# Patient Record
Sex: Female | Born: 1975
Health system: Midwestern US, Academic
[De-identification: ages and names within clinical notes are randomized; demographics above are authoritative.]

---

## 2016-01-16 IMAGING — CR LOW_EXM
2 series · 2 of 2 positions shown · non-contrast
Comparison: none

[ankle ap]
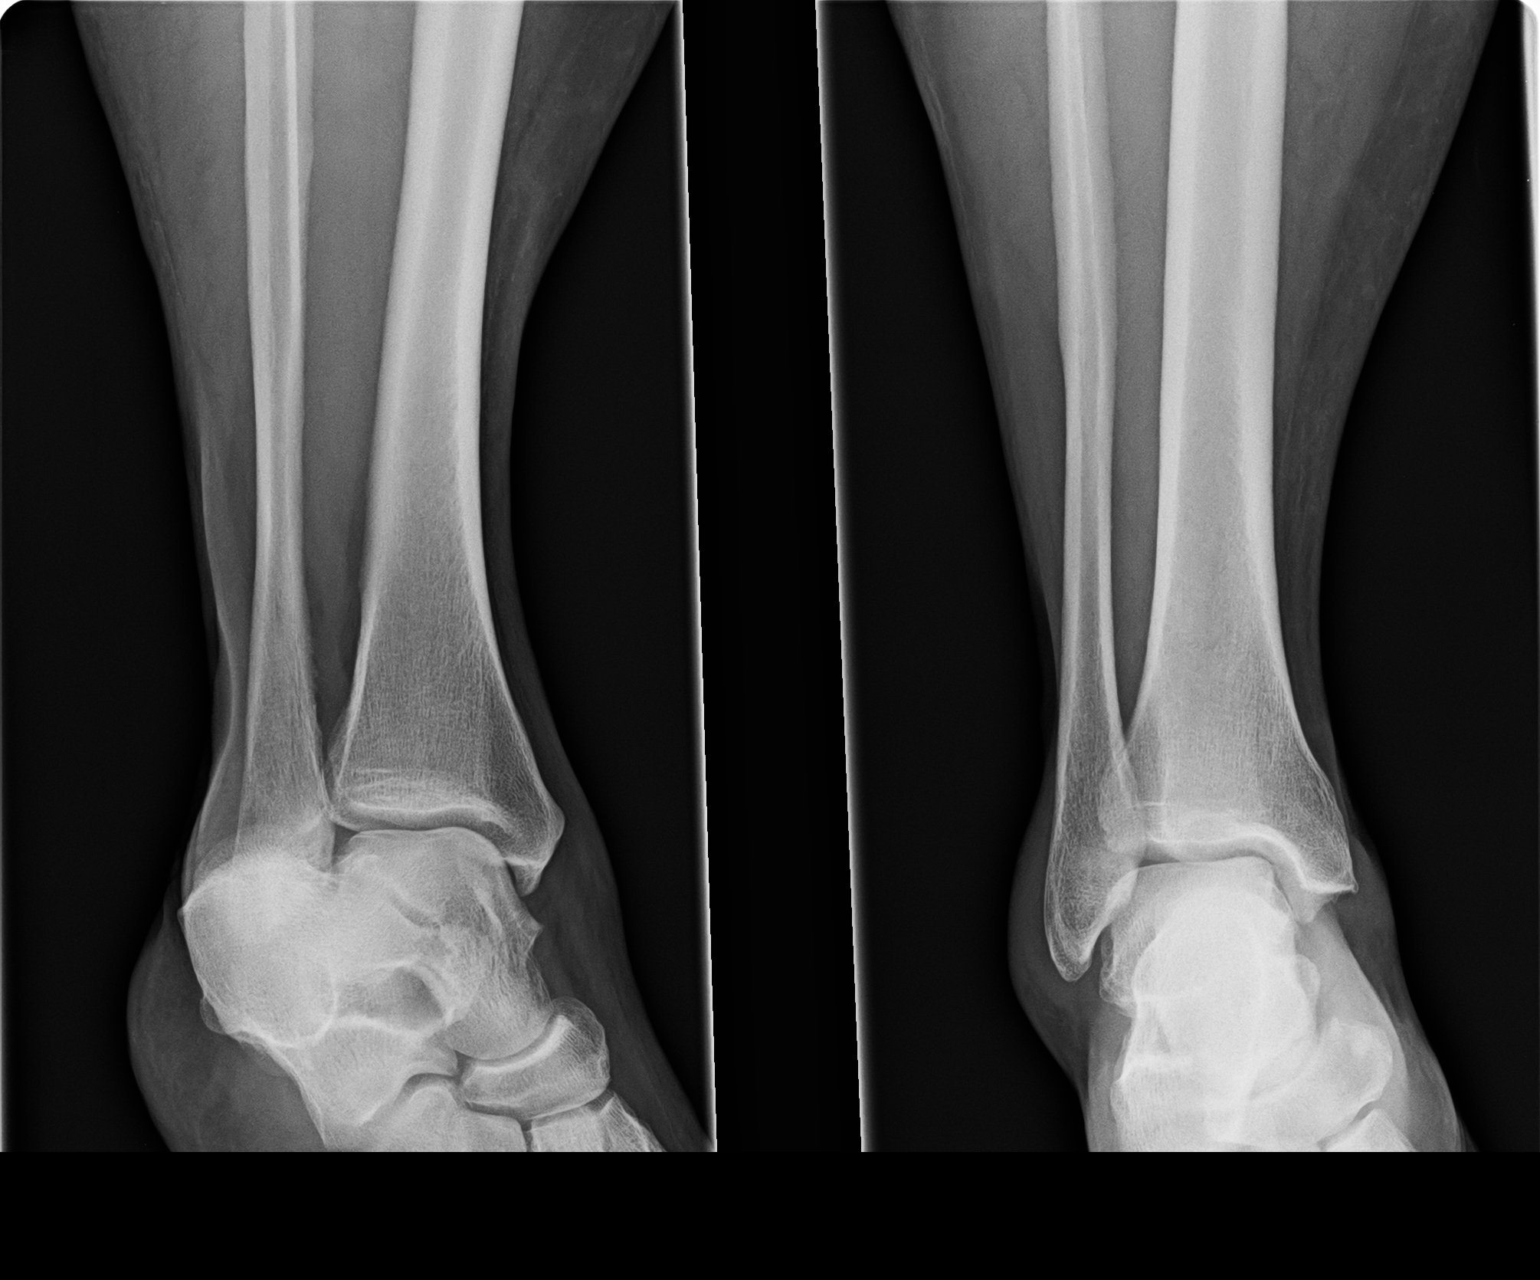

[ankle lat]
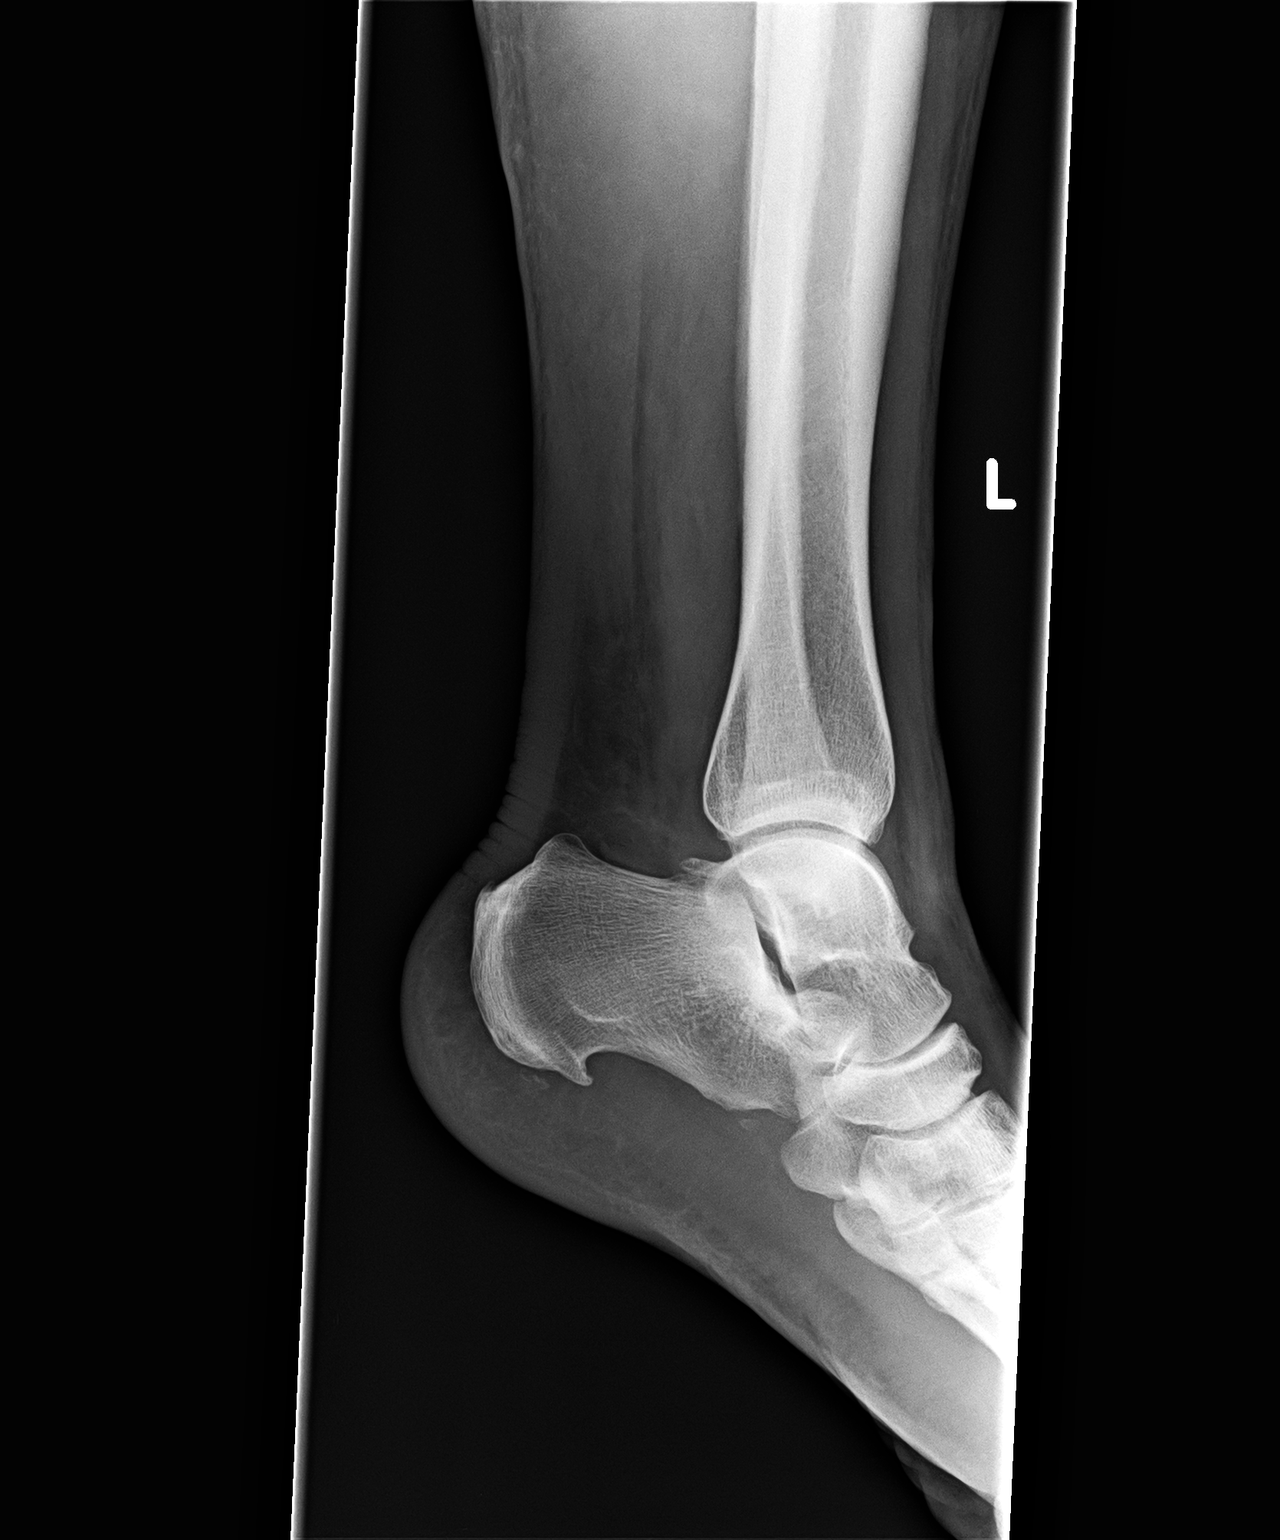

[2 of 2 positions shown; findings below may reference images not displayed]

EXAM

Left ankle.

INDICATION

walking and heard a pop. ankle pain.
TWISTED LT ANKLE WHILE WALKING. HEARD A POP. LT ANKLE PAIN AND SWELLING TO
LATERAL SIDE. NO PREV

FINDINGS

Three views of the left ankle were obtained.

There is soft tissue swelling over the lateral malleolus. There are small osteophytes of the
inferior margin of the medial malleolus. There is a heel spur at the insertion site of the plantar
fascia very small heel spur at the insertion site of the Achilles tendon.

IMPRESSION

There is no fracture or acute appearing abnormality of the left ankle. There is soft tissue
swelling laterally. There is a moderately sized heel spur at the insertion site of the plantar
fascia.

## 2016-08-19 IMAGING — CR PELVIS
5 series · 5 of 5 positions shown · non-contrast
Comparison: none

[l-spine ap]
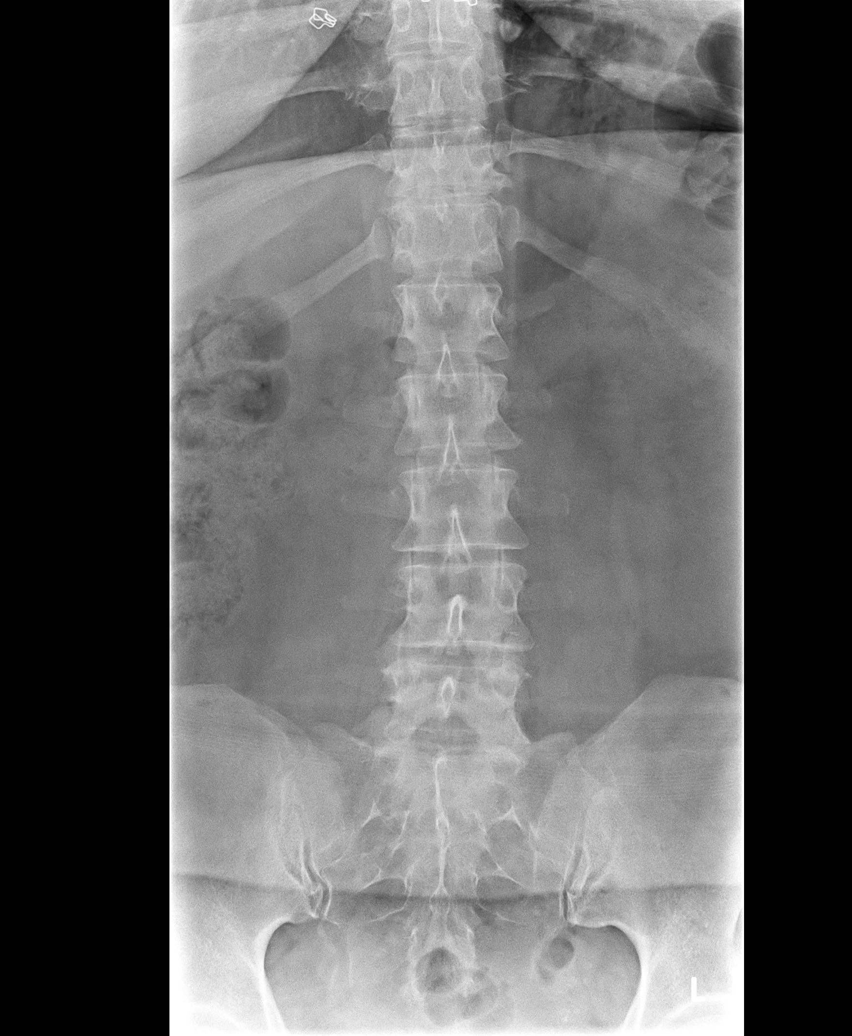

[l-spine obl (1 of 2)]
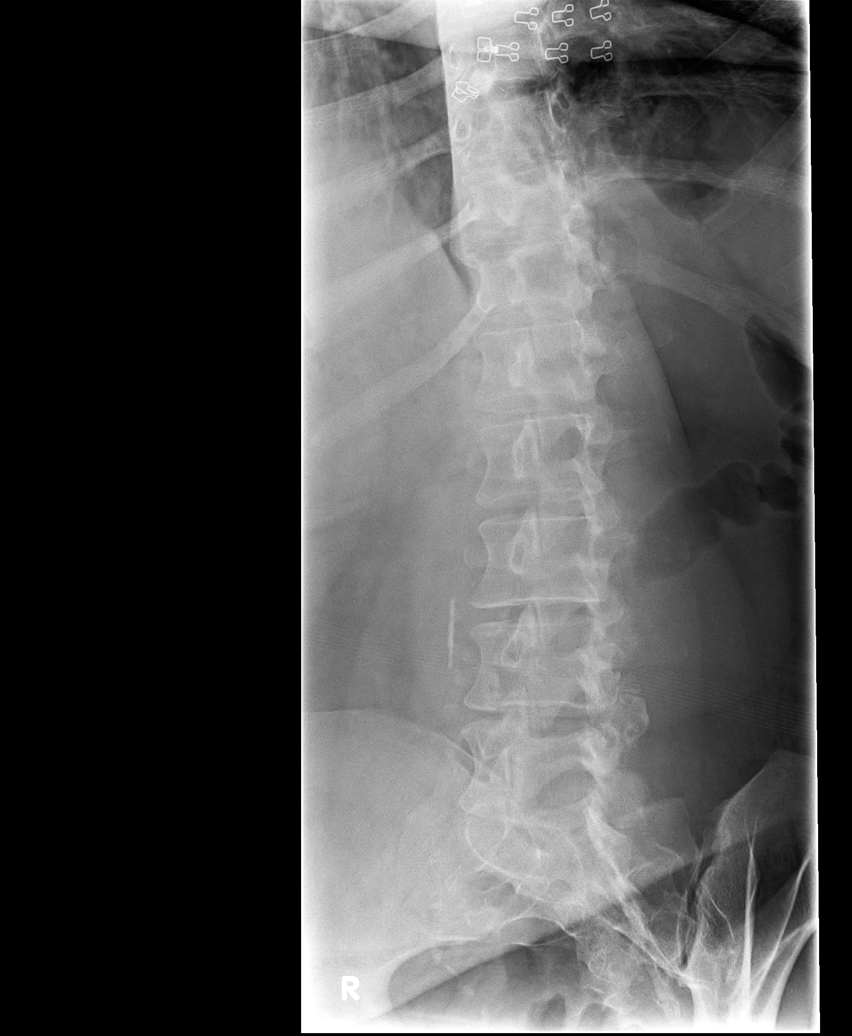

[l-spine obl (2 of 2)]
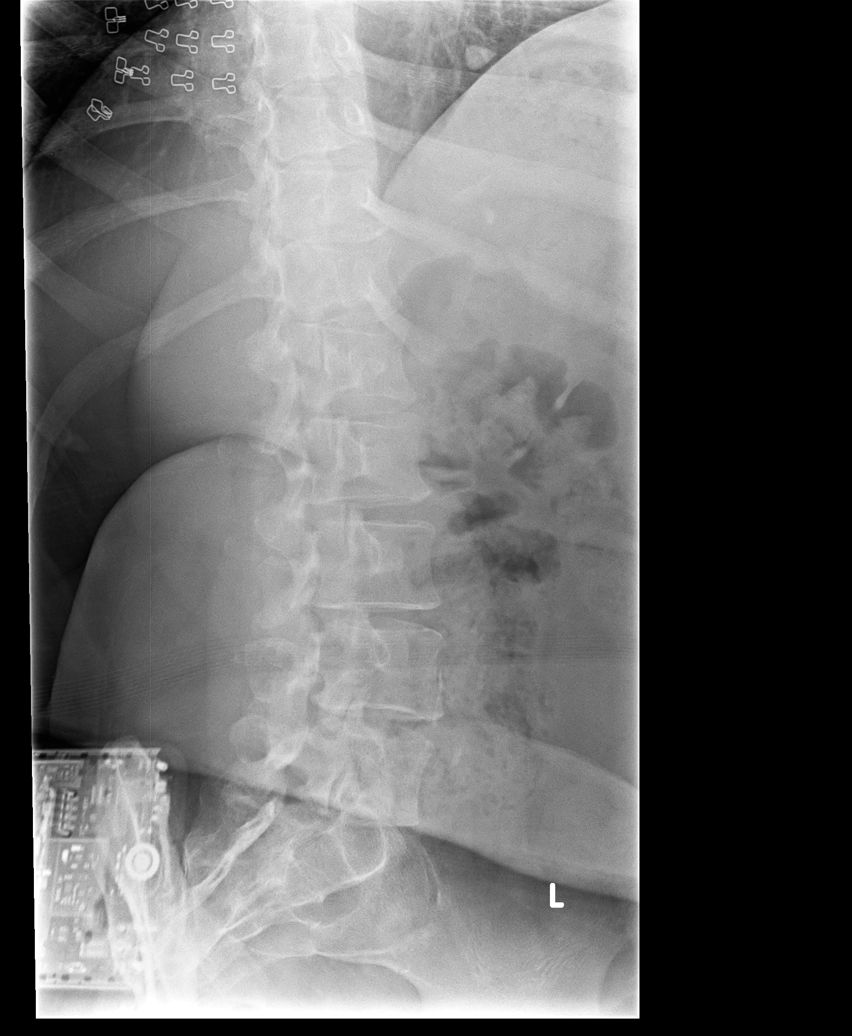

[l-spine lat]
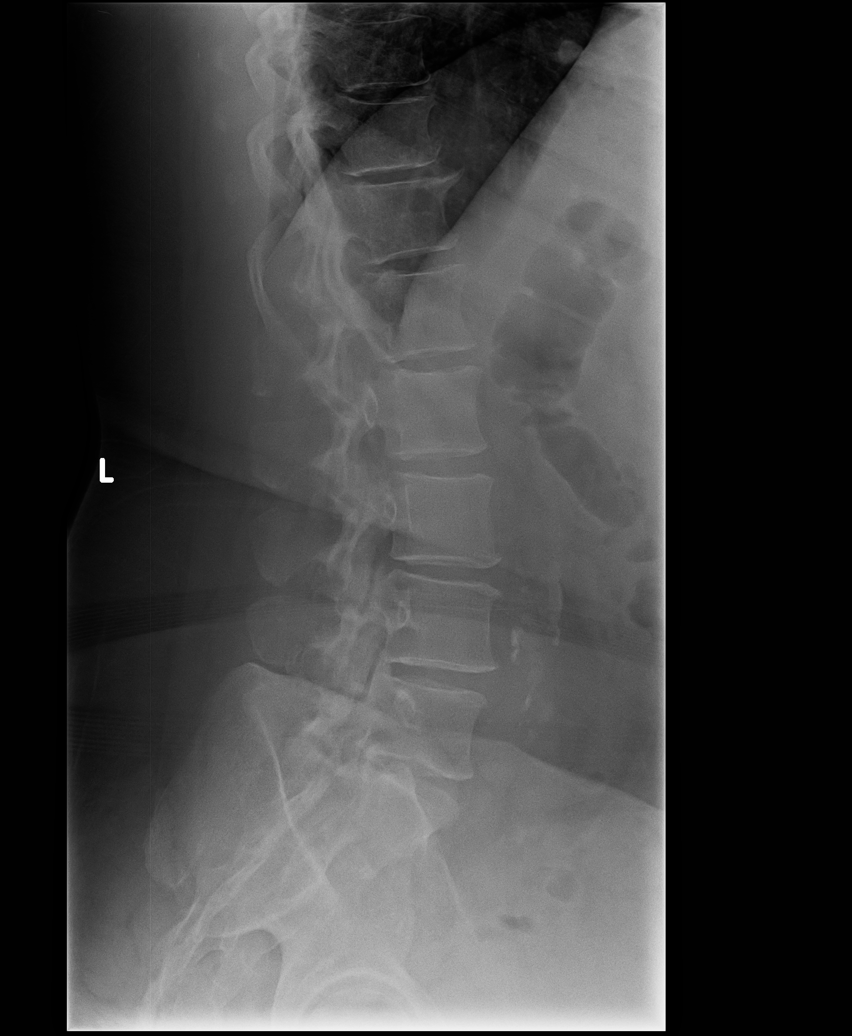

[l-spine l5-s1]
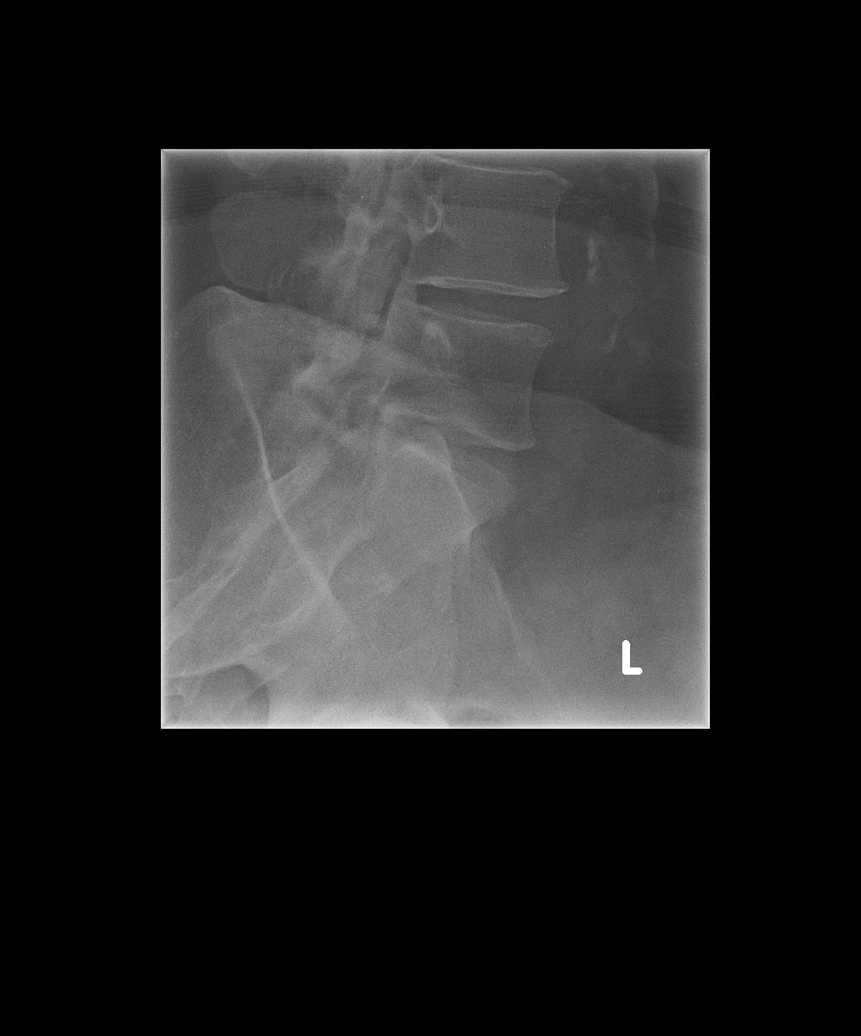

[5 of 5 positions shown; findings below may reference images not displayed]

EXAM

Complete Lumbar Spine Series

INDICATION

Acute on chronic L-spine pain, lifting man at work
PT WAS LIFTING A PT THAT HAD FALLEN, HAD PAIN TO THE MID LOW BACK. PAIN
DOWN RIGHT LEG. NO NUMBNESS OR TINGLING TO TOES

TECHNIQUE

AP, Lateral, and Oblique views were obtained

COMPARISONS

None available.

FINDINGS

The lumbar vertebral body heights are grossly maintained with straightening of the normal lumbar
lordosis. There is mild to moderate multilevel degenerative disc disease. There is moderate
multilevel facet arthrosis. Minimal atherosclerotic disease is appreciated. There is nonspecific
bowel gas pattern.

IMPRESSION

1. Mild degenerative changes without evidence of acute osseous abnormality.

## 2019-01-03 IMAGING — MG MAMMOGRAM 3D SCREEN, BILATERAL
10 of 17 series · 10 of 17 positions shown · non-contrast
Comparison: none

[R CC (1 of 2)]
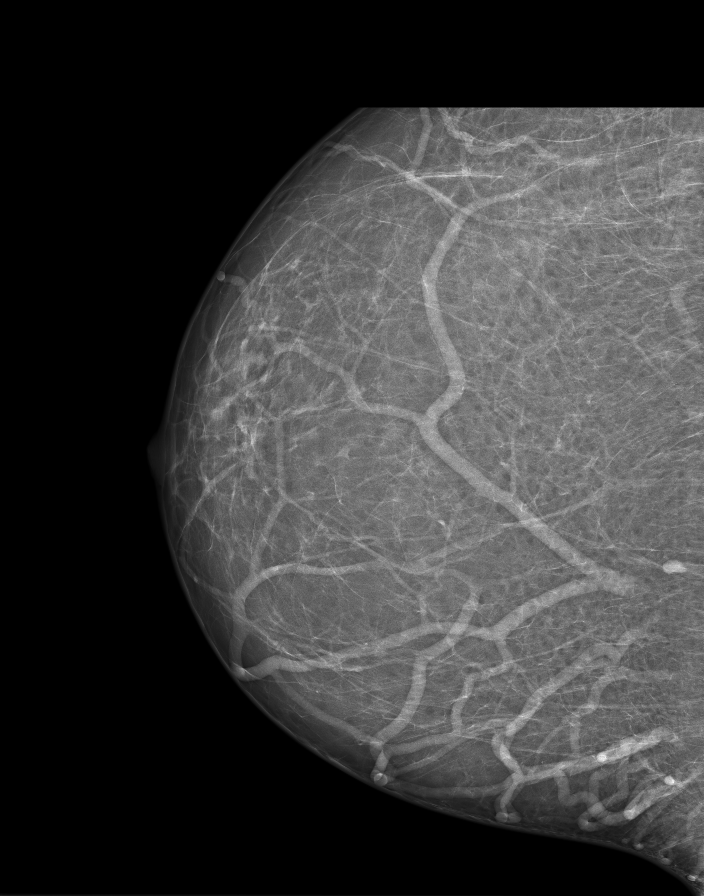

[R tomo (1 of 2)]
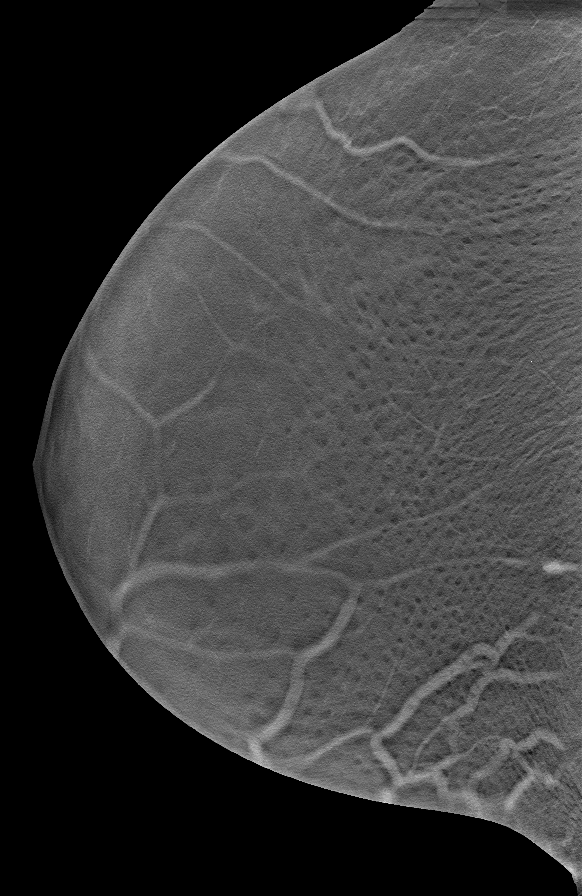

[R CC (2 of 2)]
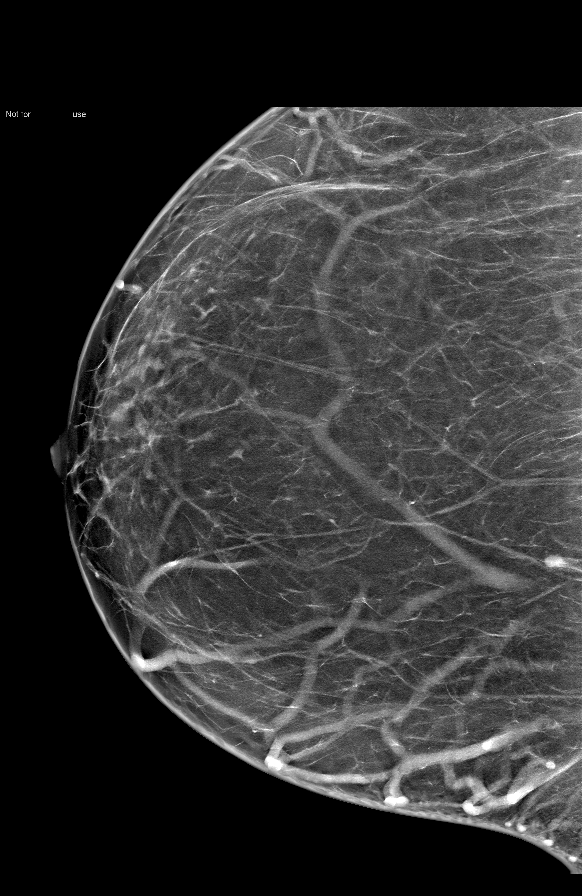

[R]
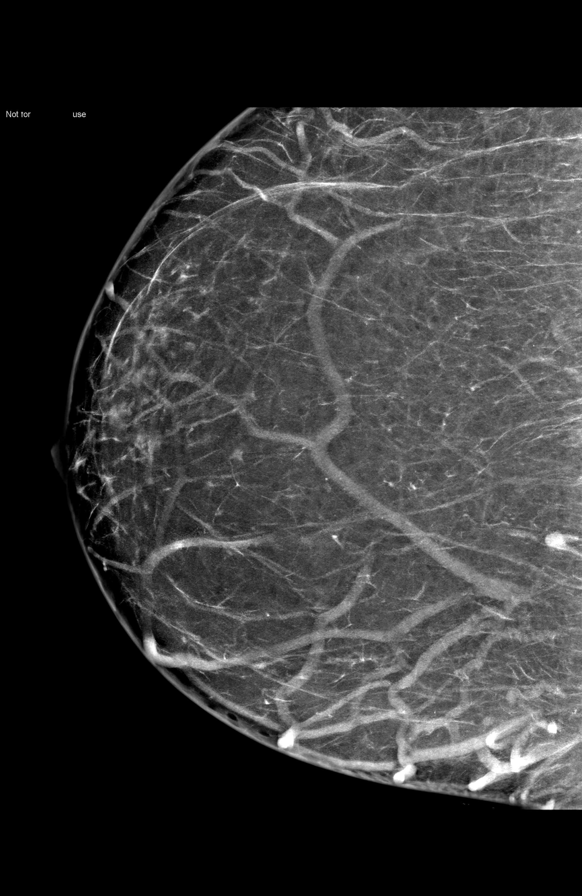

[L CC (1 of 2)]
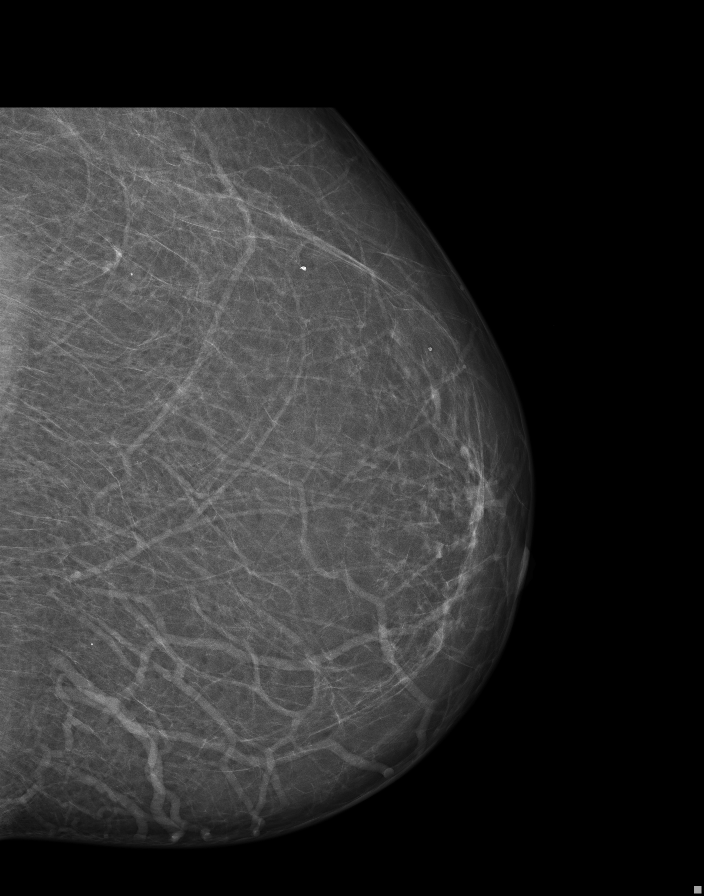

[L tomo]
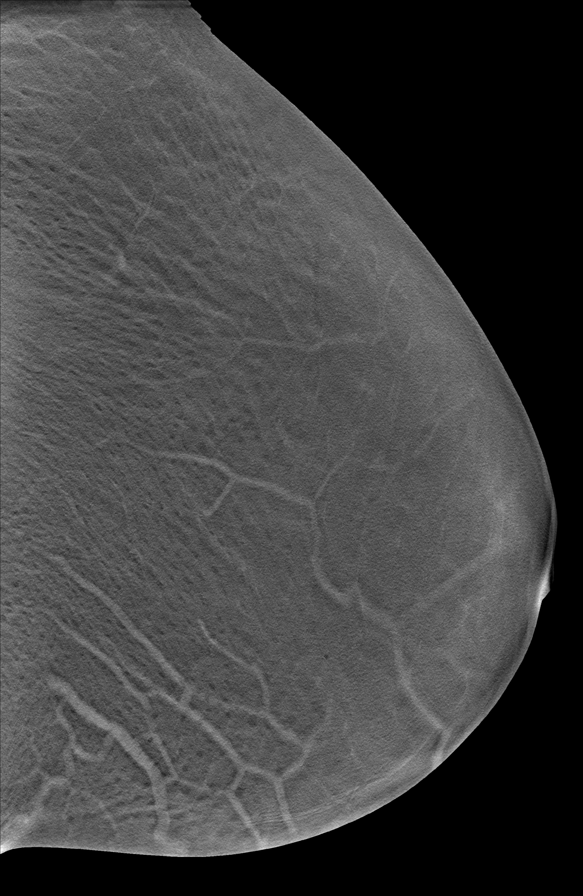

[L CC (2 of 2)]
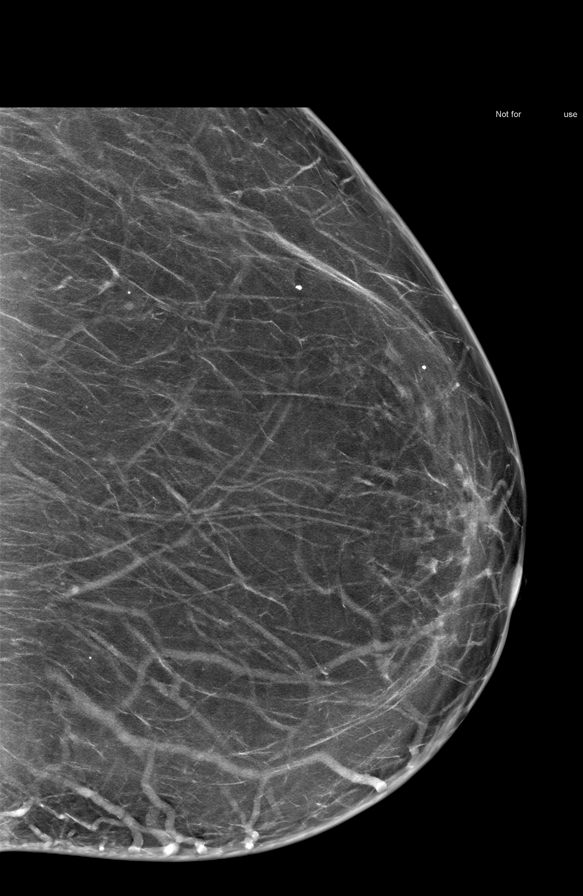

[L]
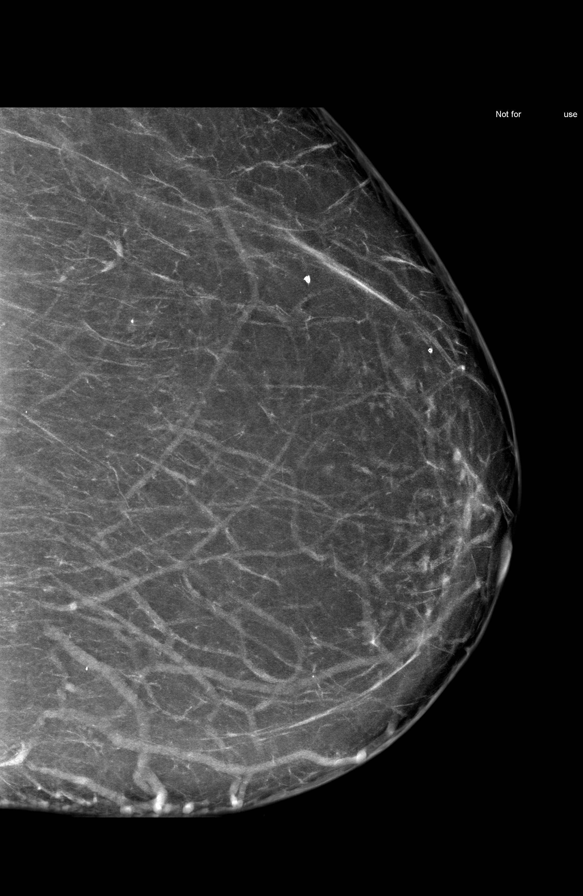

[R MLO]
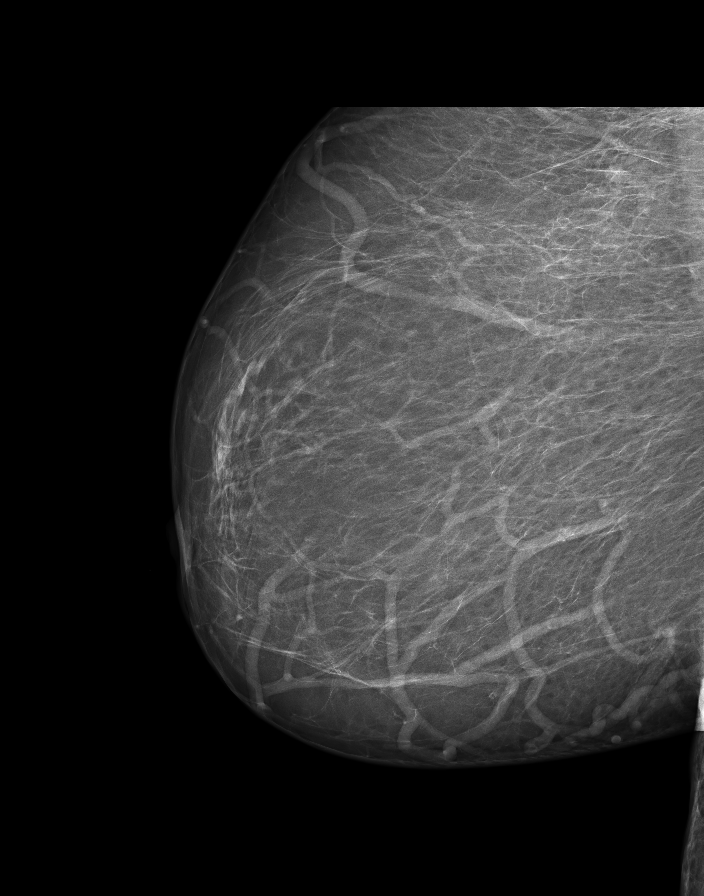

[R tomo (2 of 2)]
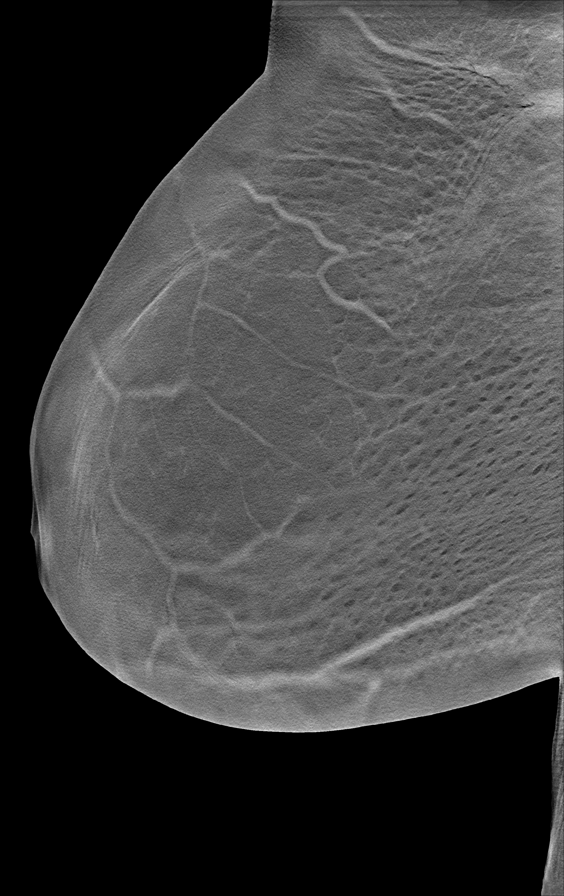

[10 of 17 positions shown; findings below may reference images not displayed]

EXAM
3D SCREENING MAMMOGRAM, BILATERAL

INDICATION
screening-left breast tenderness
SCR 3D. NO RECENT. OVER 20 YEARS AGO. MATERNAL GRANDMOTHER WITH BR CA. PT HAS LOST WEIGHT. LM

TECHNIQUE
Digital 2D CC and MLO projections obtained with 3D tomographic views per manufacturer's protocol.
ICAD version 7.2 was used during this exam.

COMPARISONS
None available

FINDINGS
ACR Type 1: <25% Breast is almost entirely fat.
No concerning masses calcifications seen.

IMPRESSION
No concerning masses or calcifications are evident. One year follow-up is recommended.
BI-RADS 2, BENIGN.

Tech Notes:

## 2019-12-23 IMAGING — CR CHEST
3 series · 3 of 3 positions shown · non-contrast
Comparison: No relevant prior studies available.

DIAGNOSTIC

EXAM:  XR RIGHT SHOULDER COMPLETE, 2 OR MORE VIEWS  (12424)
INDICATION: injury with pain Pt states she was at work and had a pt on a sit to stand, and hurt RT
shoulder TR

[shoulder internal]
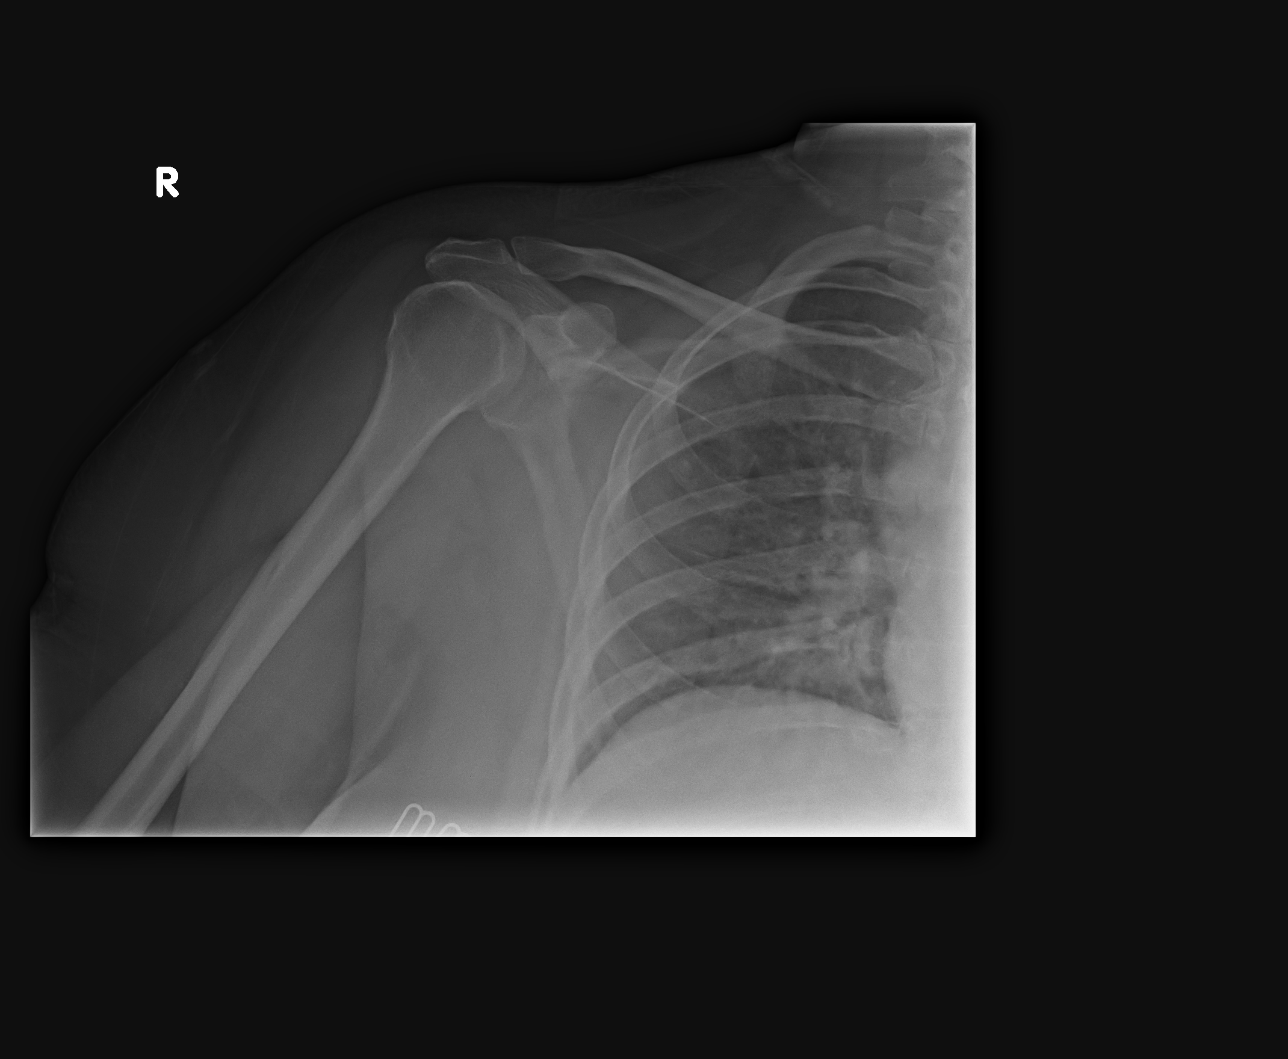

[shoulder external]
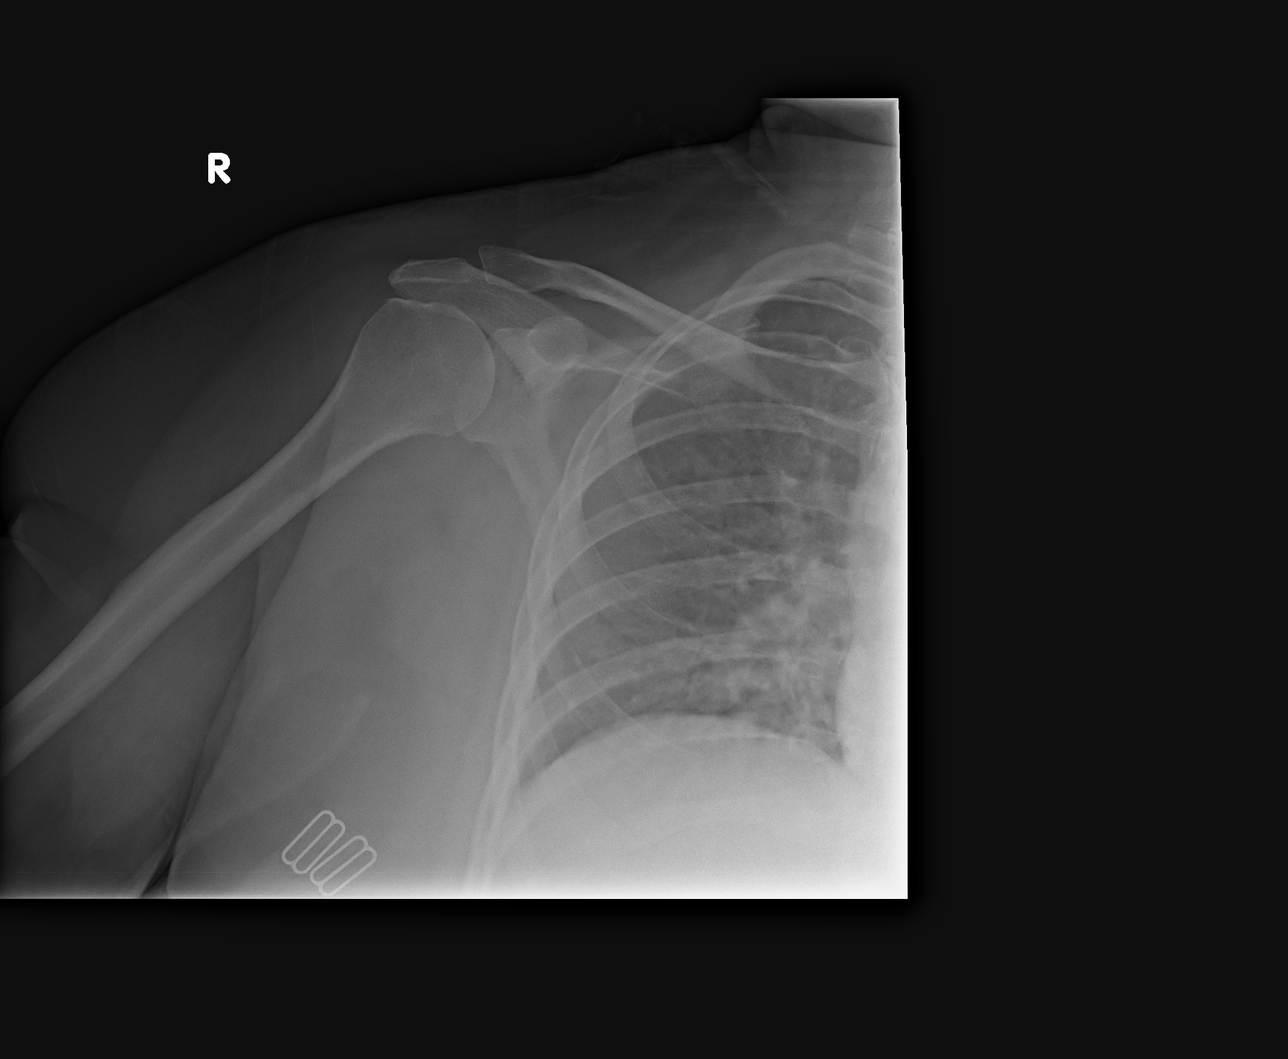

[shoulder y-view]
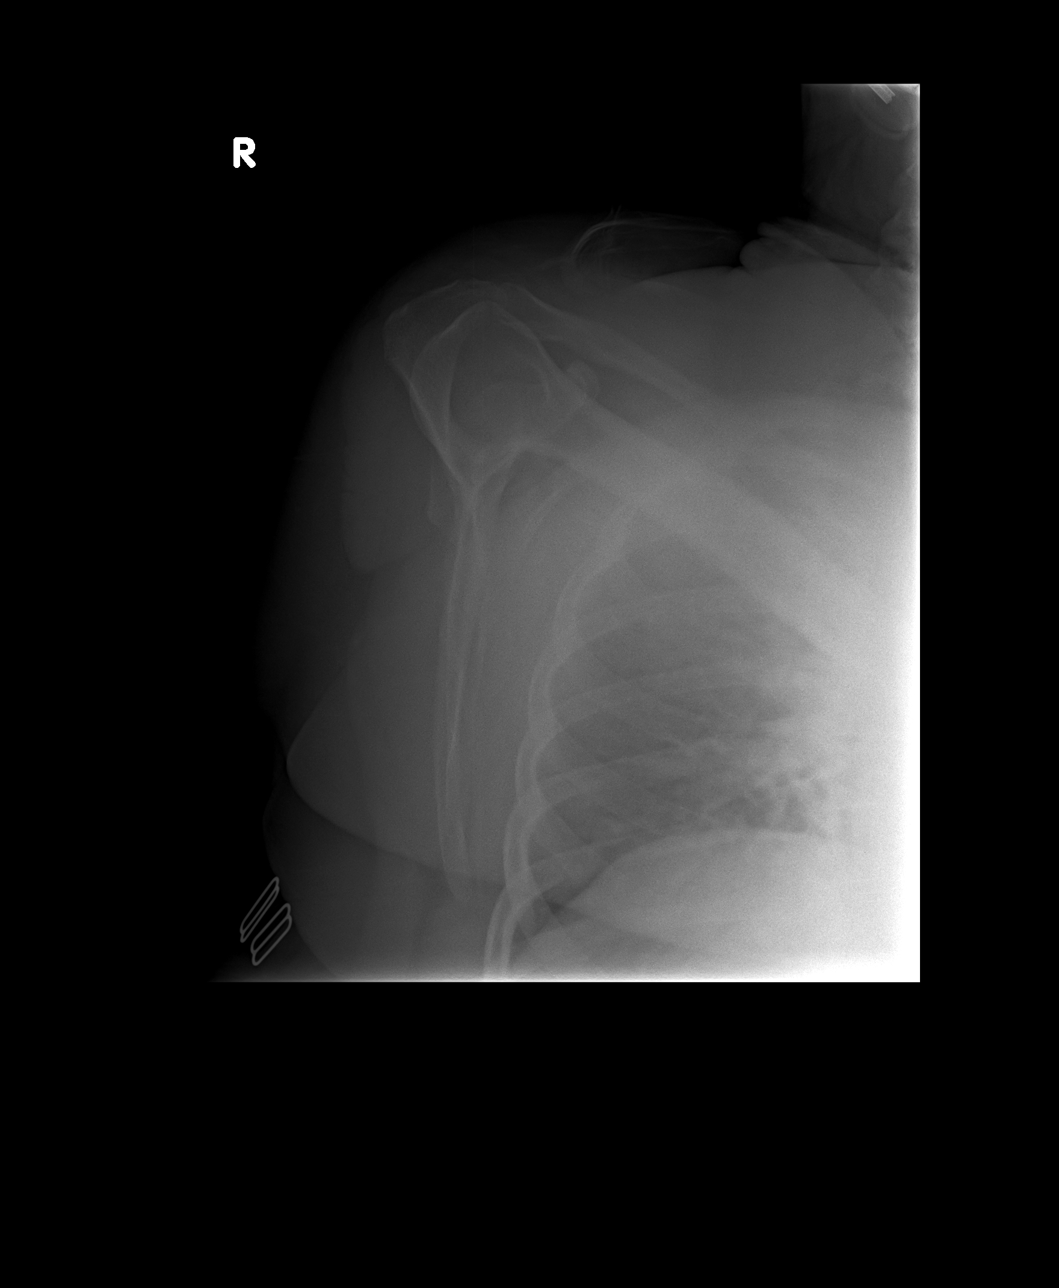

[3 of 3 positions shown; findings below may reference images not displayed]

FINDINGS: BONES/JOINTS:  NO definite fracture or dislocation.

SOFT TISSUES:  No radiopaque foreign body.
IMPRESSION: - There is NO evidence of acute fractures or dislocations.

Tech Notes:

Pt states she was at work and had a pt on a sit to stand, and hurt RT shoulder
TR

## 2021-06-10 IMAGING — MG MAMMOGRAM, DIGITAL SCREEN BILA
1 series · 4 of 4 positions shown · non-contrast
Comparison: none

[Series 2: R CC · right · 4 of 4 slices shown]
[im 1/4]
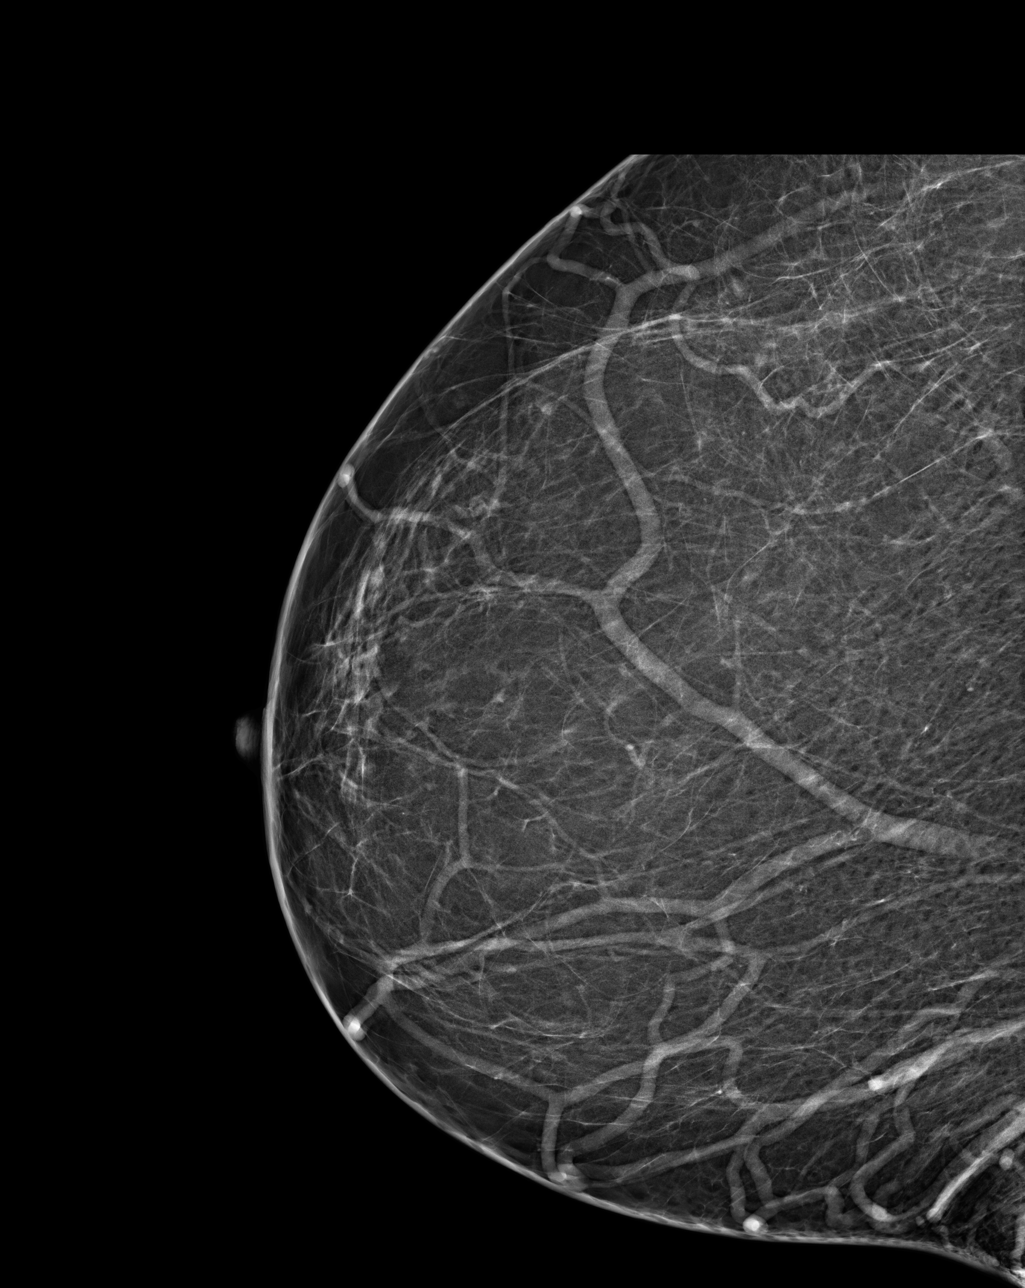
[im 2/4]
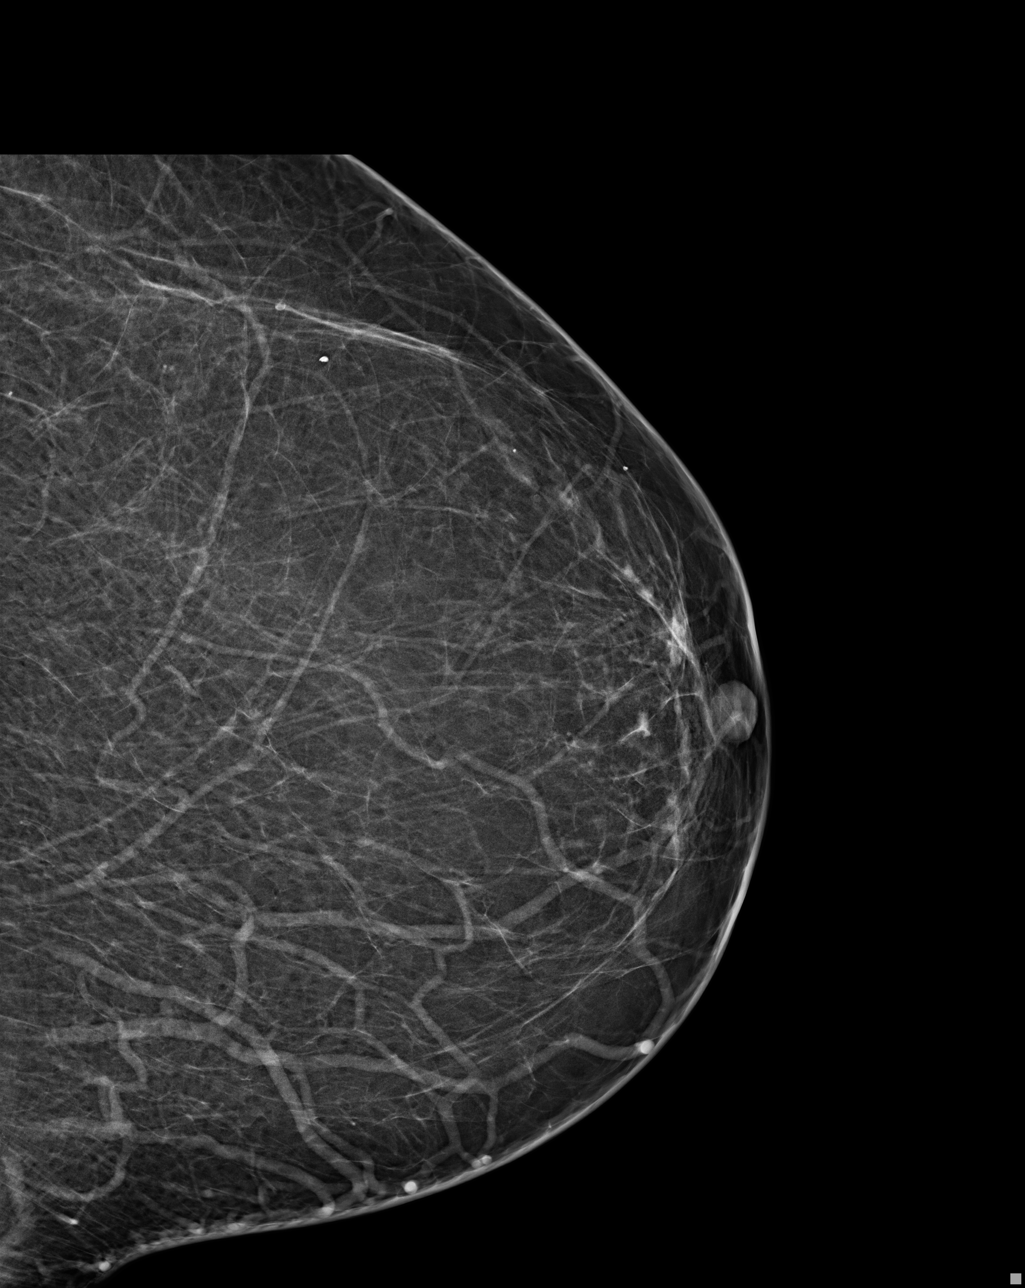
[im 3/4]
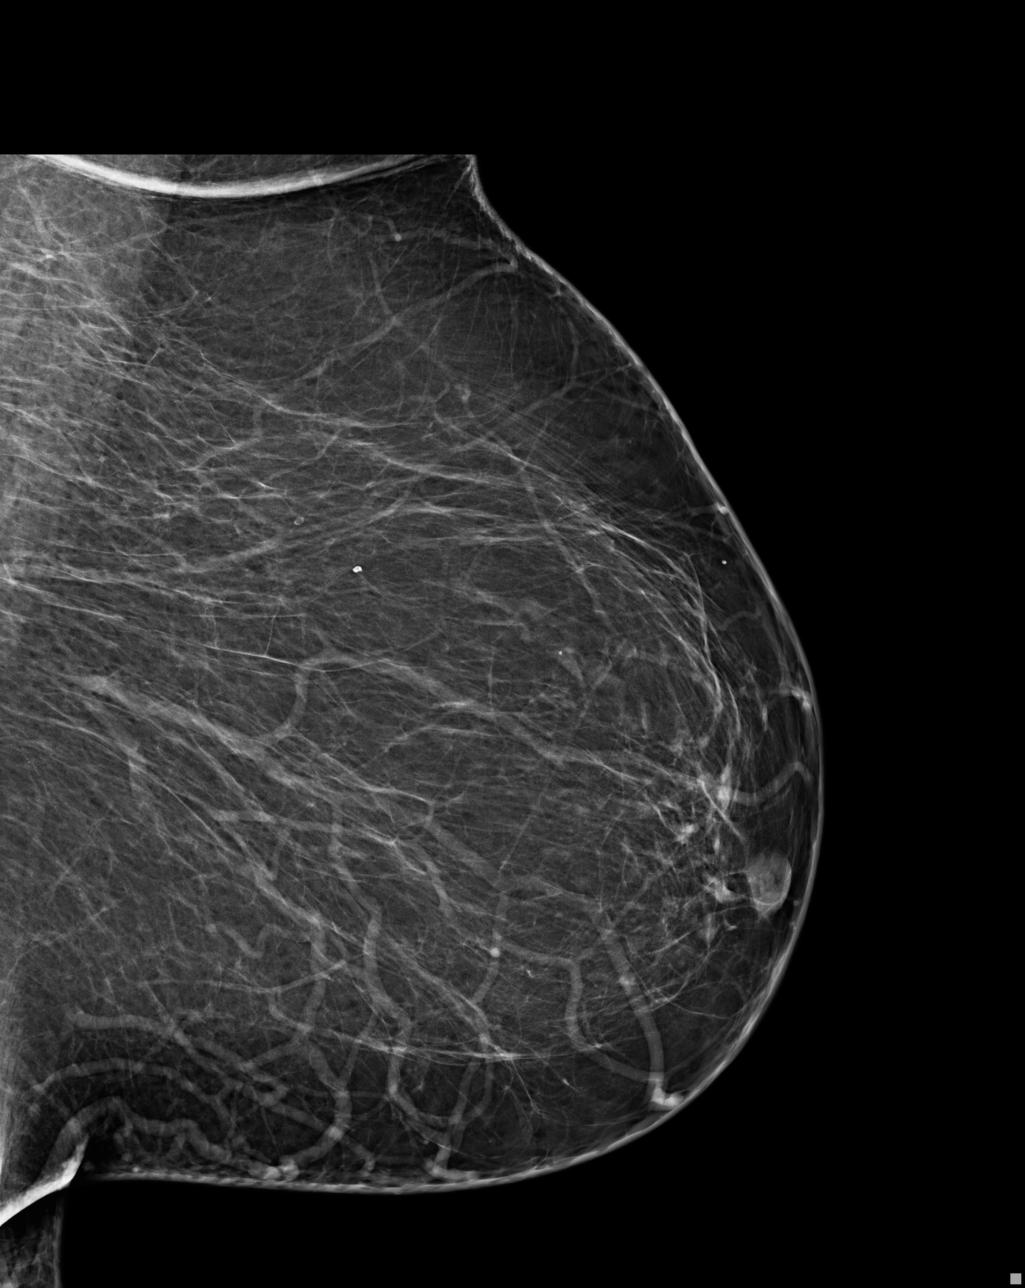
[im 4/4]
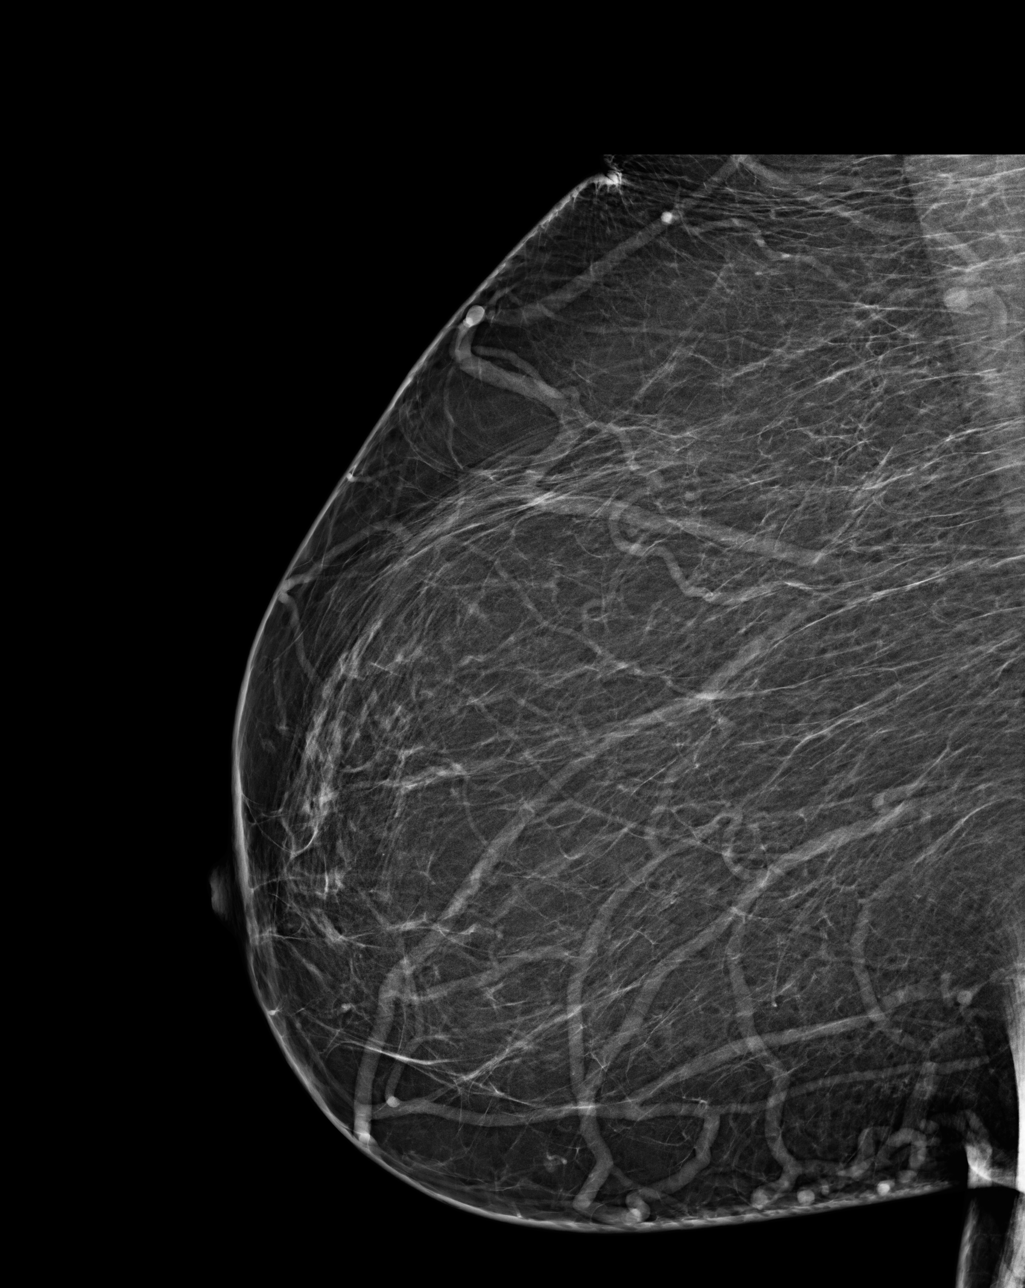

[4 of 4 positions shown; findings below may reference images not displayed]

EXAM

SCREENING MAMMOGRAM, BILATERAL

INDICATION

screening
screening. kf 2d. priors [id number]

TECHNIQUE

Bilateral craniocaudal and medial lateral oblique views were generated and reviewed with computer-
aided detection.

COMPARISONS

January 03, 2019

FINDINGS

ACR Type 1:  <25% Breast is almost entirely fat.

No concerning masses, calcifications, or interval changes are seen.

IMPRESSION

Stable bilateral mammography. One year follow-up is recommended.

BI-RADS 2, BENIGN.

Tech Notes:

## 2021-10-11 ENCOUNTER — Encounter: Admit: 2021-10-11 | Discharge: 2021-10-11

## 2021-10-11 IMAGING — CT BRAIN WO(Adult)
3 of 8 series · 13 of 47 positions shown, 15 images · non-contrast
Comparison: none

PROCEDURE: BRAIN WO(Adult)
HISTORY: AMS. NO PREV. AK
TECHNIQUE: Axial CT imaging of the brain was performed without contrast. No comparison study
available. This exam was performed using one or more the following dose reduction techniques:
Automated exposure control, adjustment of the mA and/or KV according to the patient's size or use of
iterative reconstruction technique. Total DLP dose measures 698 mGy with a total CTDI dose measuring

[Series 4: brain cor 5.00 hr40 s3 · coronal · 0.32mm/px · 3 of 36 slices shown]
[im 9/36  brain]
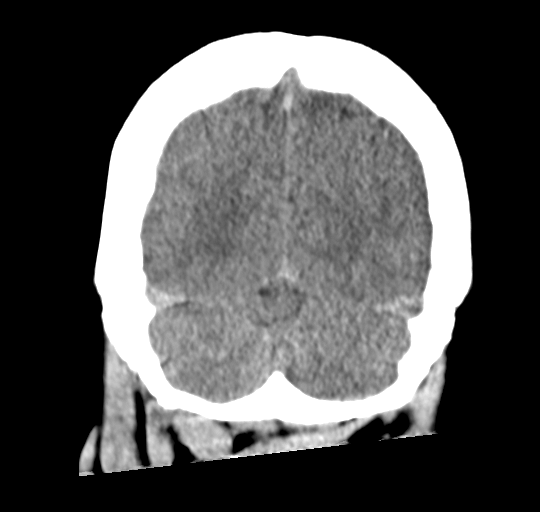
[im 18/36  brain]
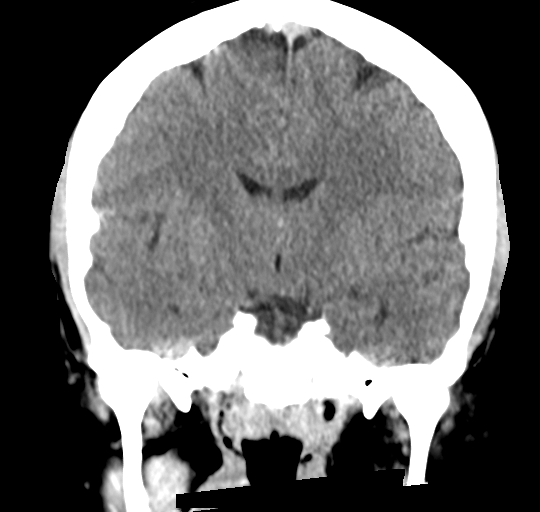
[im 27/36  brain]
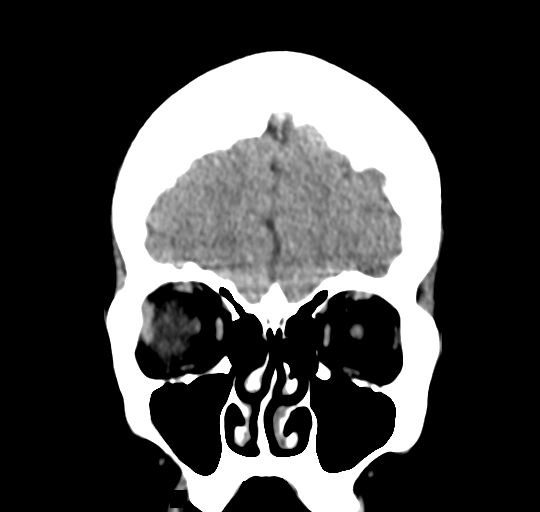

[Series 8: brain ax 2.00 hr60 s3 · axial · 0.34mm/px · z∈[-458,-321]mm · 8 of 84 slices shown, 10 images]
[im 7/84  brain]
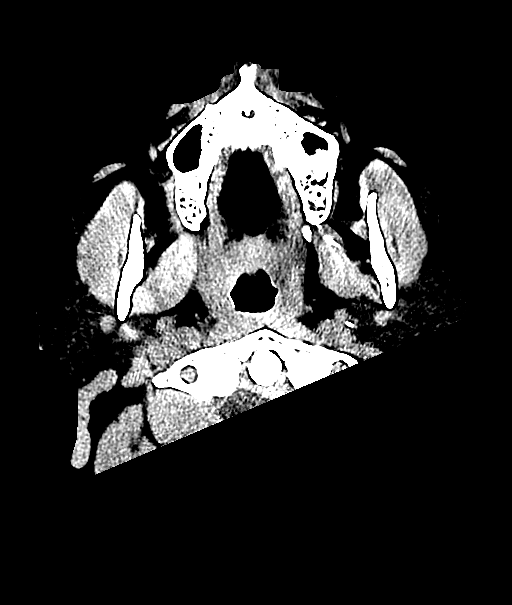
[im 7/84  bone]
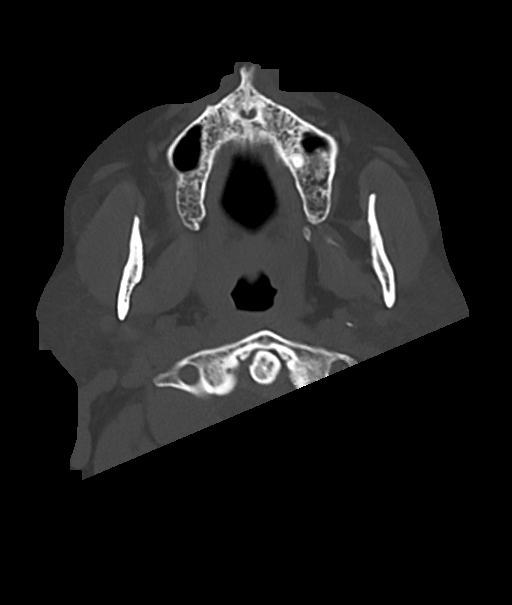
[im 20/84  brain]
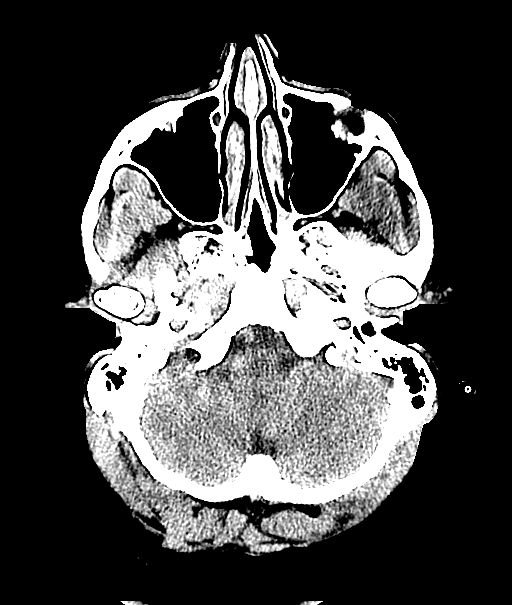
[im 26/84  brain]
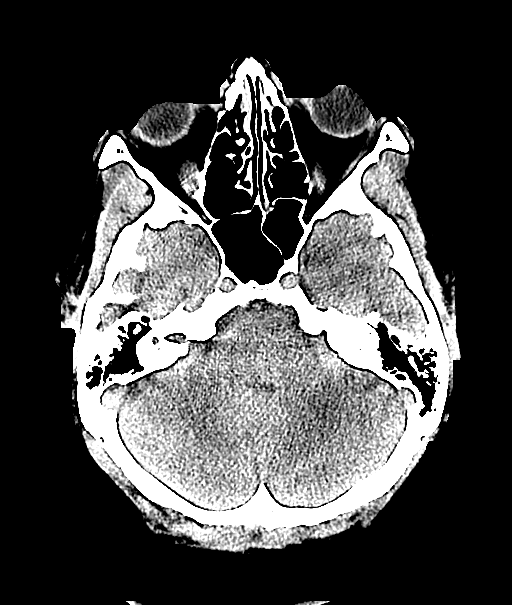
[im 39/84  brain]
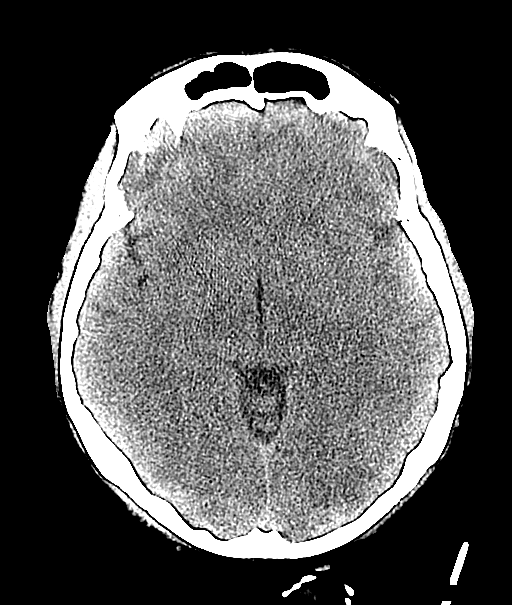
[im 45/84  brain]
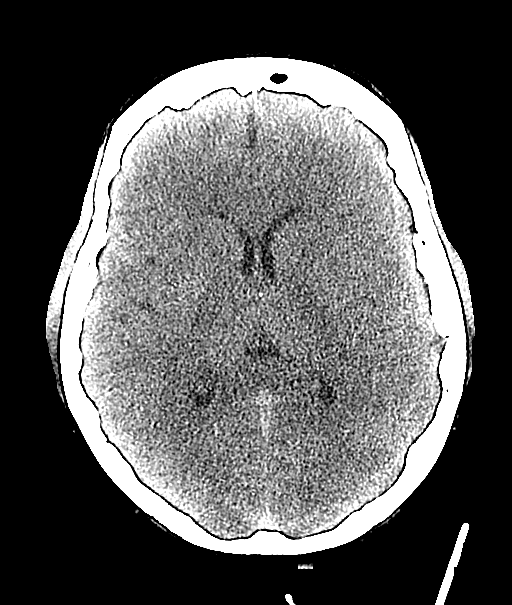
[im 45/84  bone]
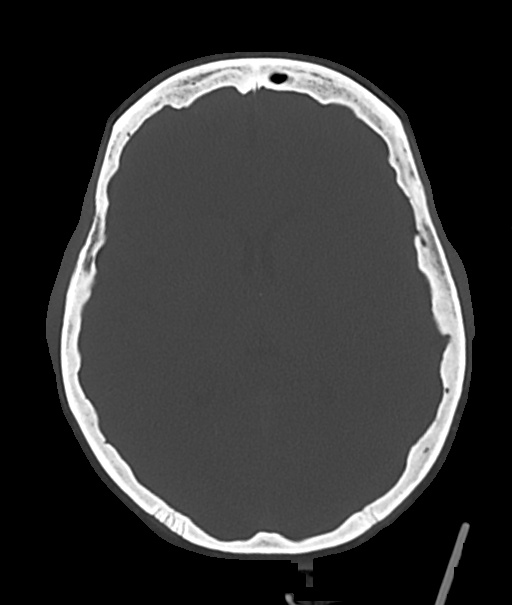
[im 58/84  brain]
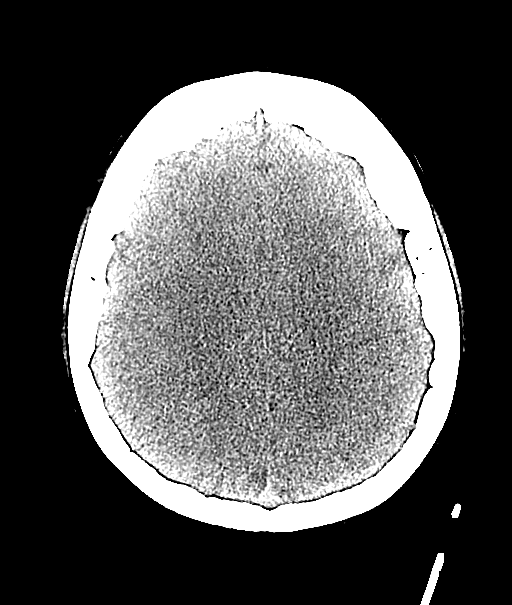
[im 64/84  brain]
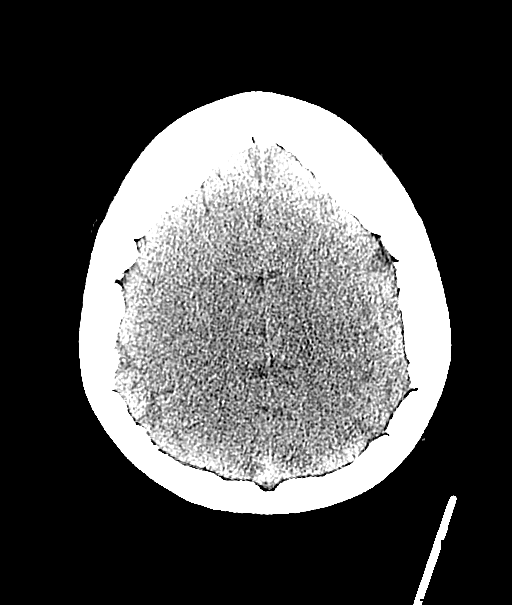
[im 77/84  brain]
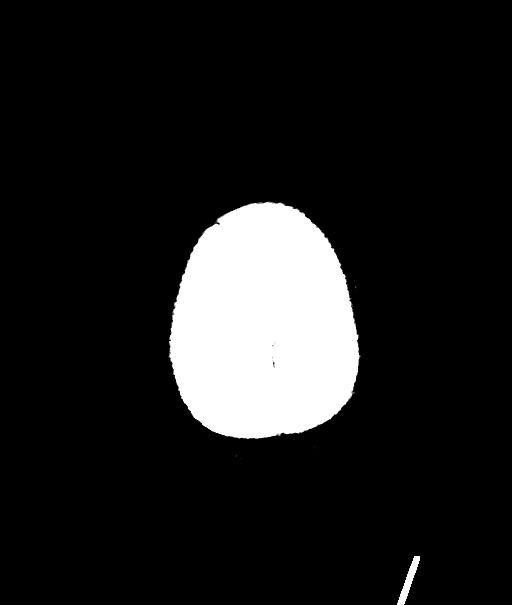

[Series 14: brain sag 5.00 hr40 s3 · sagittal · 0.18mm/px · 2 of 32 slices shown]
[im 11/32  brain]
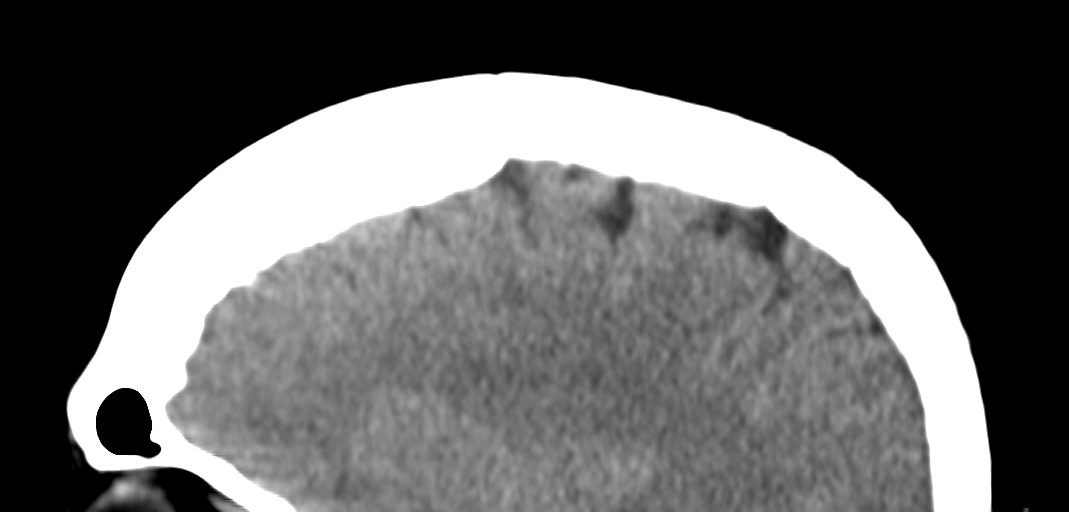
[im 21/32  brain]
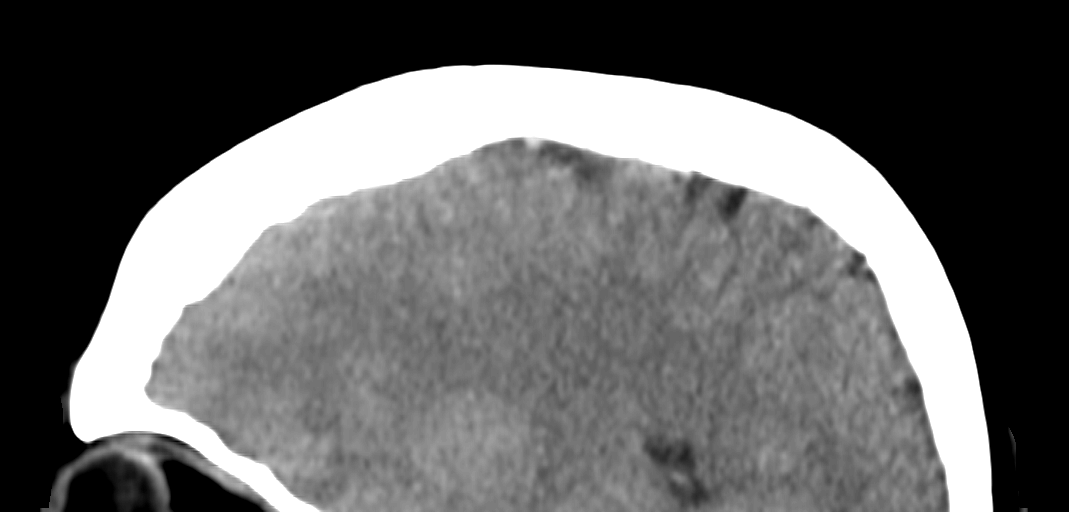

[13 of 47 positions shown; findings below may reference images not displayed]

FINDINGS: There is good gray white differentiation and sulcal detail. I don't see any acute large
vessel distribution infarction, intracranial bleed, or focal mass. No significant periventricular or
subcortical hypodensities are seen. The ventricles are normal in size, shape, and midline in
position. I don't see any cerebellopontine angle mass. CT visualization of the brainstem, basal
ganglia, and cerebellum are within normal limits. No displaced skull fracture seen. The paranasal
sinuses are clear.
IMPRESSION: No acute large vessel distribution infarction, intracranial bleed, or focal mass seen.

Tech Notes:

AMS.

## 2021-10-11 IMAGING — CR XR chest 1V
1 series · 1 of 1 positions shown · non-contrast
Comparison: none

PROCEDURE: XR chest 1V
HISTORY: CHEST PAIN, AMS. PREV 03/09/20 - AK

[x chest ap]
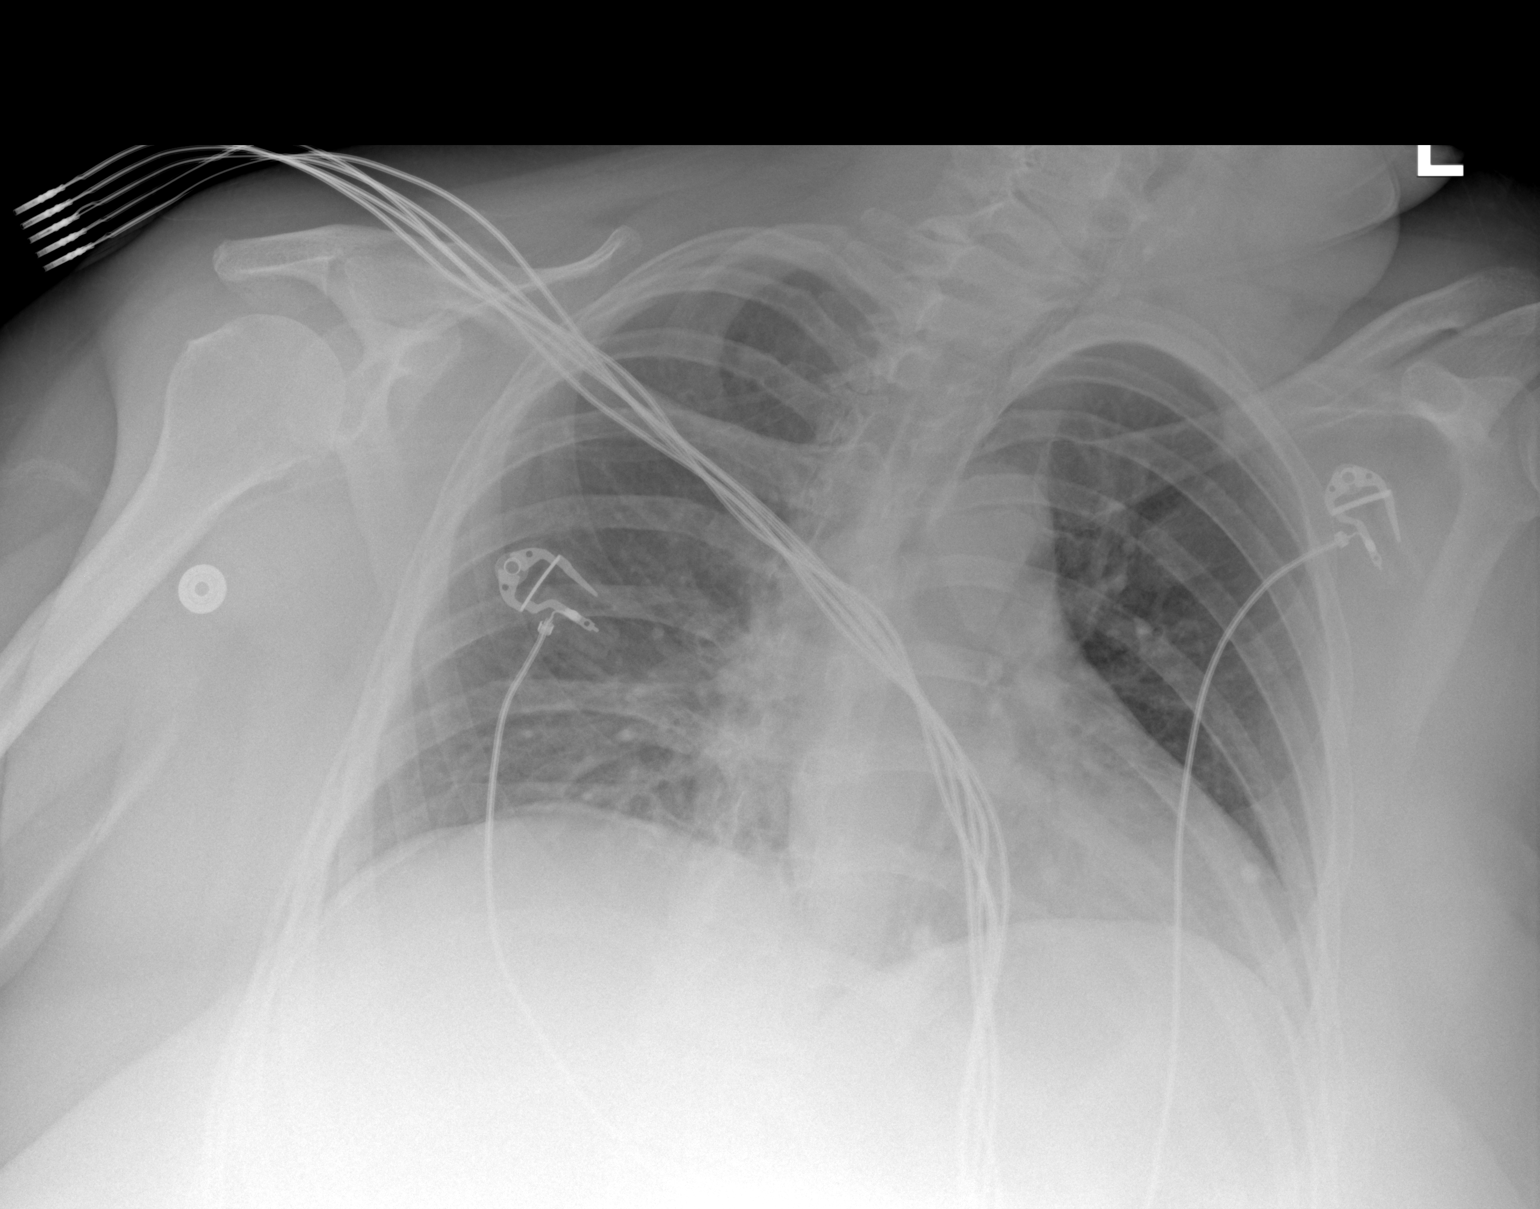

[1 of 1 positions shown; findings below may reference images not displayed]

FINDINGS: Single view chest with comparison exam dated 03/09/20 shows the cardiac silhouette and
mediastinal structures are within normal limits. Bilateral peribronchial thickening is seen in the
hilum. No focal lung consolidation is seen. There are bilateral low lung volumes seen with vascular
crowding and bibasilar atelectasis seen. There is no pleural effusion or pneumothorax seen.
Visualized osseous structures show no acute findings.
IMPRESSION: 1. Bilateral peribronchial thickening consistent with bronchitis.
2. Low lung volumes with vascular crowding and basilar atelectasis.

Tech Notes:

AMS.

## 2021-10-11 IMAGING — MR Head^Brain
1 series · 48 of 48 positions shown · non-contrast
Comparison: none

[Series 4: cow · axial · 0.8mm · 0.56mm/px · z∈[-60,+6]mm · 48 of 83 slices shown]
[im 1/83]
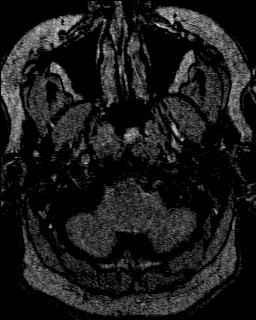
[im 2/83]
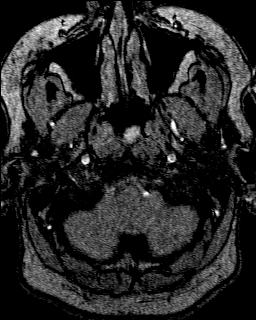
[im 4/83]
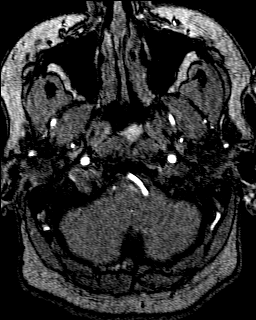
[im 6/83]
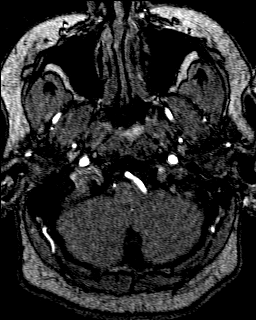
[im 7/83]
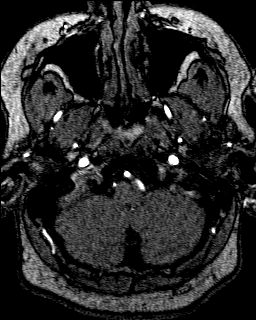
[im 9/83]
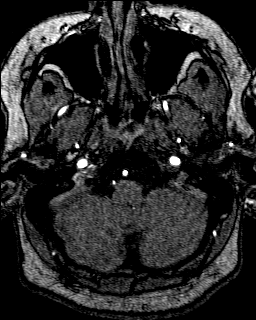
[im 11/83]
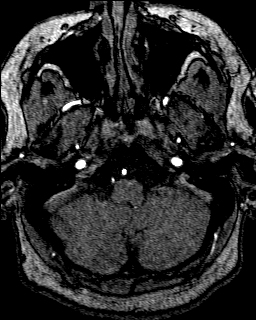
[im 13/83]
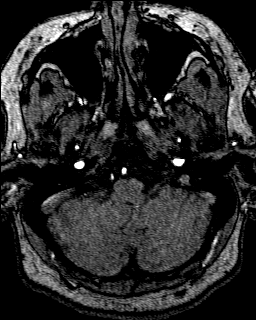
[im 14/83]
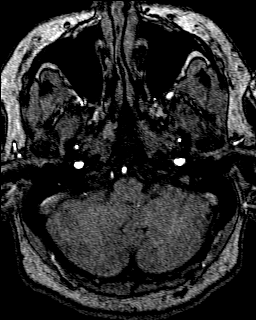
[im 16/83]
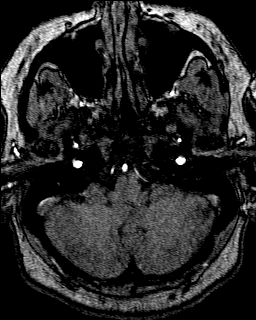
[im 18/83]
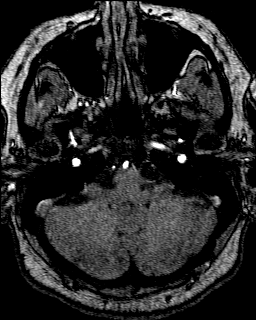
[im 20/83]
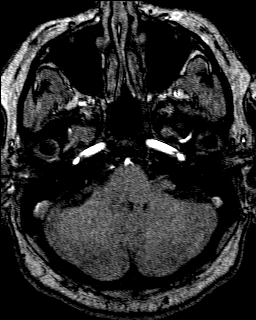
[im 21/83]
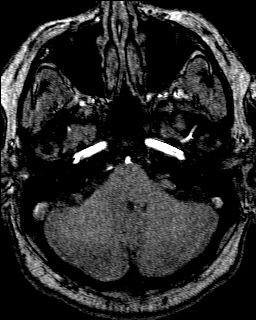
[im 23/83]
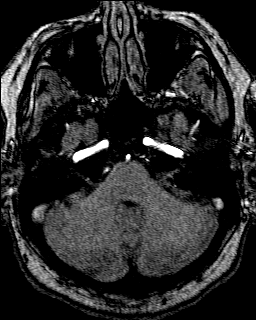
[im 25/83]
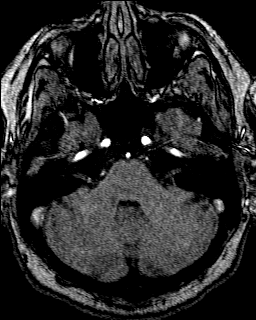
[im 27/83]
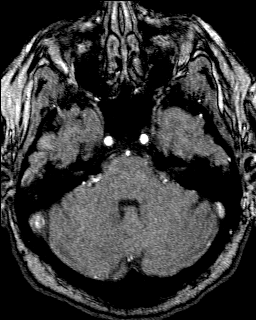
[im 28/83]
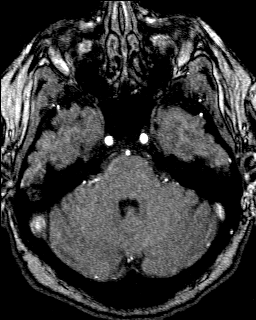
[im 30/83]
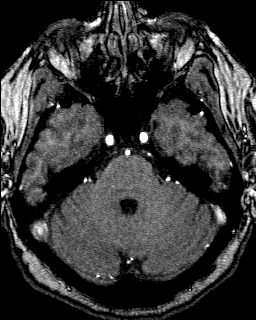
[im 32/83]
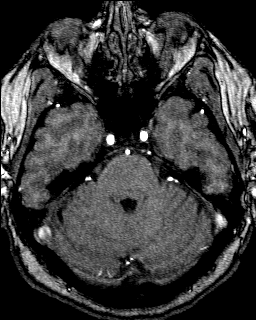
[im 34/83]
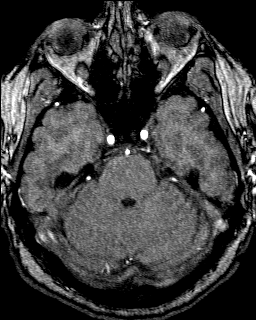
[im 35/83]
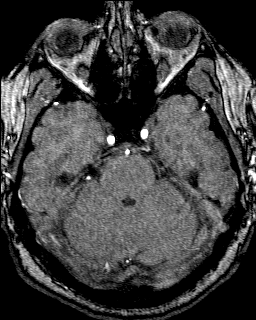
[im 37/83]
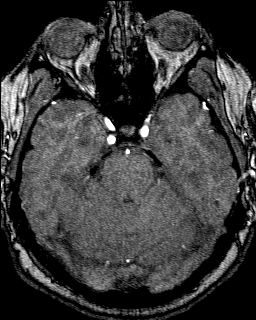
[im 39/83]
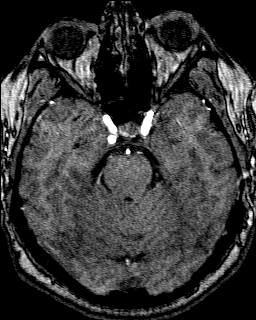
[im 41/83]
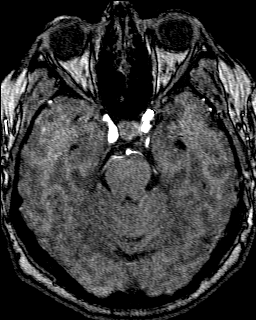
[im 42/83]
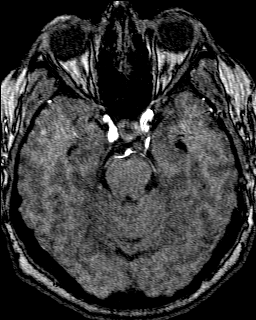
[im 44/83]
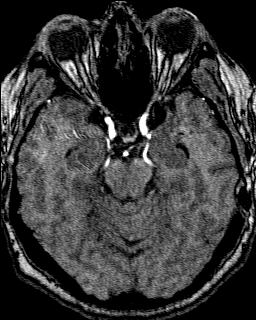
[im 46/83]
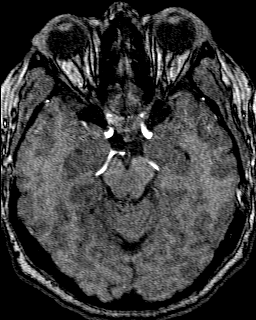
[im 48/83]
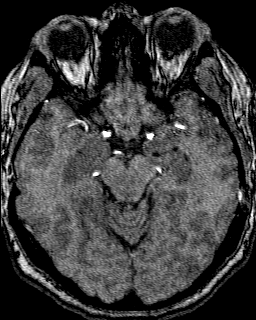
[im 49/83]
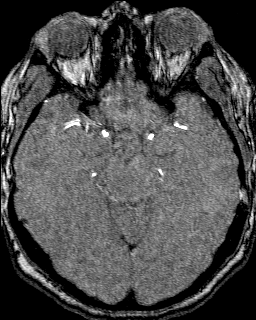
[im 51/83]
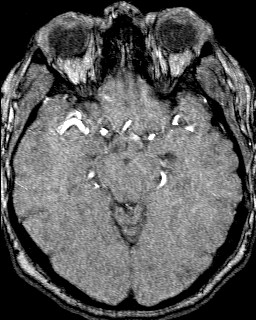
[im 53/83]
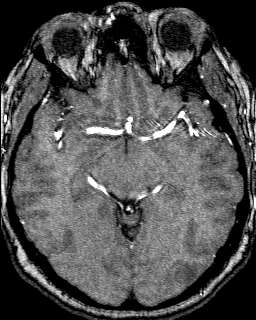
[im 55/83]
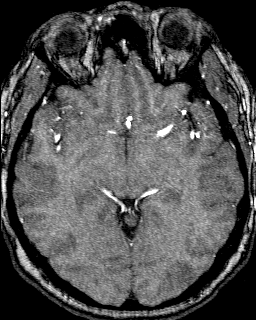
[im 56/83]
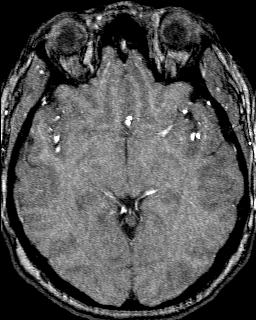
[im 58/83]
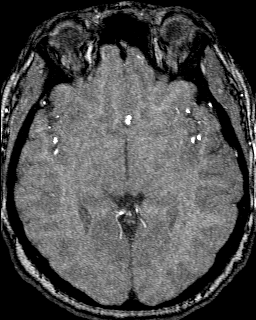
[im 60/83]
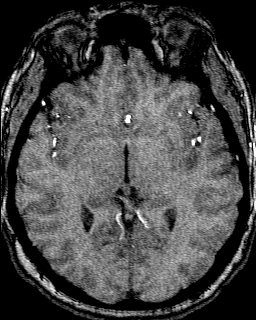
[im 62/83]
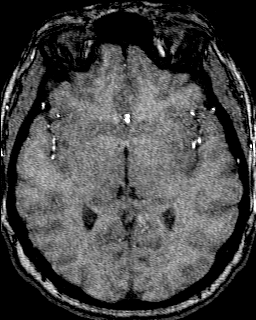
[im 63/83]
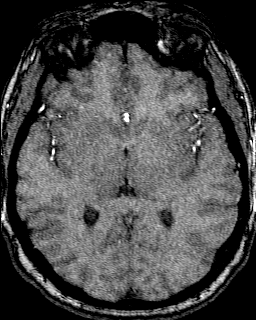
[im 65/83]
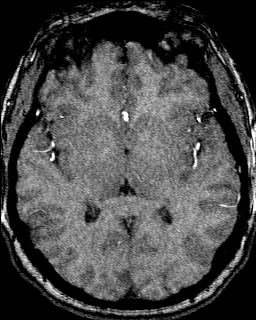
[im 67/83]
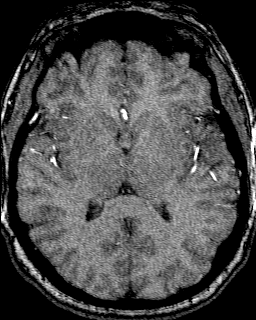
[im 69/83]
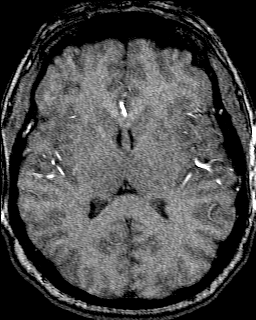
[im 70/83]
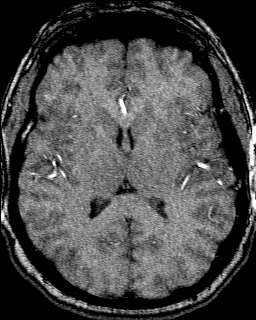
[im 72/83]
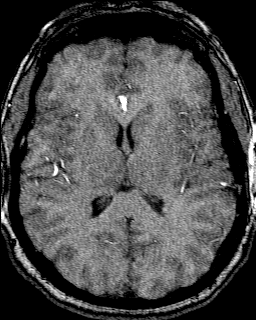
[im 74/83]
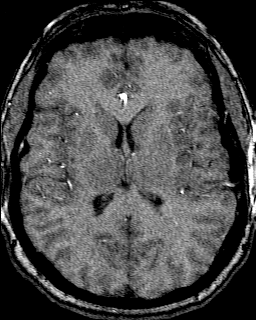
[im 76/83]
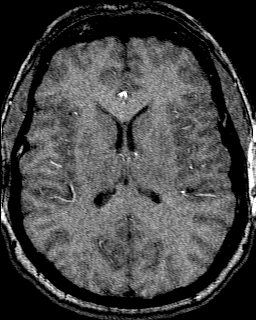
[im 77/83]
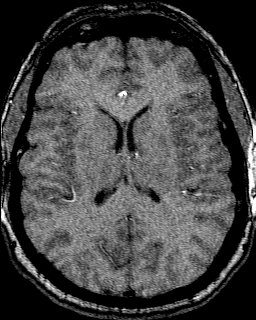
[im 79/83]
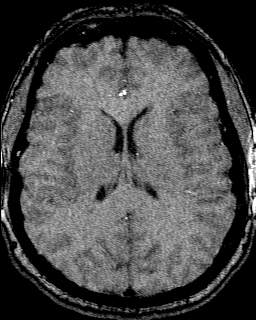
[im 81/83]
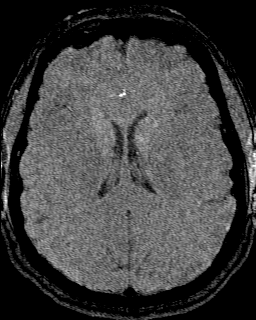
[im 83/83]
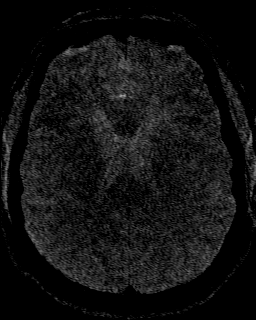

[48 of 48 positions shown; findings below may reference images not displayed]

DIAGNOSTIC STUDIES

EXAM

MRI of the brain and MR angiography with without contrast

INDICATION

AMS
PT PRESENTED TO ER LAST NIGHT WITH AMS, CONFUSION, HIGH BP. PT HAS NO MEMORY OF IT.  HUSBAND STATES
PT HAD SEVERE HEADACHE, THEN FELL TO GROUND, URINATED, BLUE LIPS.  PT STATES SHE STILL HAS PRESSURE
TO THE HEAD.  15 ML
GADAVIST RG

TECHNIQUE

Sagittal axial coronal images were obtained with variable T1 and T2 weighting before after
administration contrast. 3D  time-of-flight MR angiography was obtained through the brain.

COMPARISONS

None available

FINDINGS

Multiple FLAIR signal abnormalities are noted throughout the brain. Left cortical occipital lesion
measures 1.5 cm image 12 series tendon demonstrates minimal enhancement and equivocal diffusion
restriction. Small cortical focus measuring 1.3 cm adjacent to the falx in the left parietal
occipital region is seen image 19 series 10. There is no enhancement in this location or diffusion
restriction.

There is subtle FLAIR signal involving cortical gyri bilaterally at the cerebral vertex image 20
series 10. There are also multiple small areas of FLAIR signal involving the supratentorial white
matter for instance a 6 millimeter focus within the right frontal parietal white matter image 16
series 10. These also do not demonstrate abnormal enhancement. Questionable subtle FLAIR signal can
be identified within the posterior cerebellar hemispheres bilaterally.

There is no intracranial hemorrhage or mass effect. Ventricular system is normal. There is normal
opacification of the major intracranial vascular structures.

3D time-of-flight MR angiography demonstrates normal flow throughout the vertebral basilar system
and cavernous carotid arteries is well as the circle Willis without evidence for stenosis or vessel
cutoff.

Differential considerations would include atypical manifestation of PRES given posterior
predominant distribution. Infectious process such as Lyme disease or other atypical organisms should
also be considered. Demyelinating process such as MS is felt unlikely given cortical involvement but
not completely excluded. Vasculitis could possibly account for some of the findings. Low-grade
neoplasm could also have this appearance.

IMPRESSION

Multiple areas of T2 FLAIR signal as described and discussed above with peripheral cortical
enhancing focus in the left occipital lobe. Please see above discussion. Lumbar puncture may be
beneficial and appropriate neurology consultation is recommended. Report called to Jhoshua Samano at

Tech Notes:

PT PRESENTED TO ER LAST NIGHT WITH AMS, CONFUSION, HIGH BP. PT HAS NO MEMORY OF IT.  HUSBAND STATES
PT HAD SEVERE HEADACHE, THEN FELL TO GROUND, URINATED, BLUE LIPS.  PT STATES SHE STILL HAS PRESSURE
TO THE HEAD.  15 ML GADAVIST RG

## 2021-10-11 IMAGING — MR Head^Brain
9 of 10 series · 41 of 48 positions shown · non-contrast
Comparison: none

[Series 4: T1 · sagittal · 5.0mm · 0.45mm/px · 1 of 7 slices shown (1 of 2)]
[im 1/7]
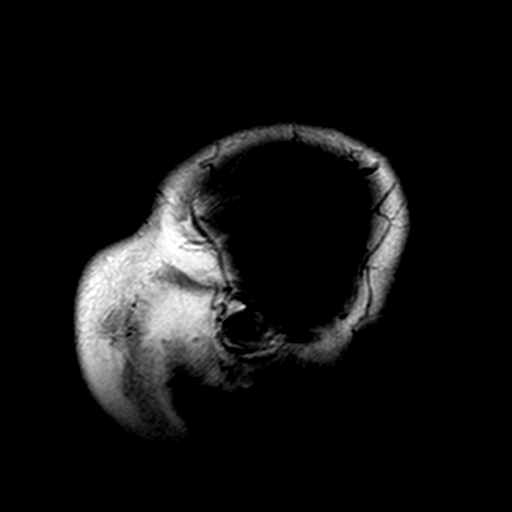

[Series 5: DWI · axial · 5.0mm · 1.80mm/px · z∈[-34,+60]mm · 4 of 20 slices shown (1 of 2)]
[im 1/20]
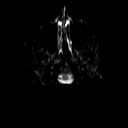
[im 7/20]
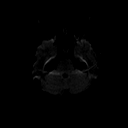
[im 13/20]
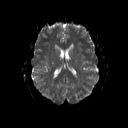
[im 20/20]
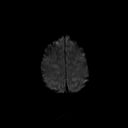

[Series 6: DWI · axial · 5.0mm · 1.80mm/px · z∈[-23,+49]mm · 2 of 9 slices shown (2 of 2)]
[im 1/9]
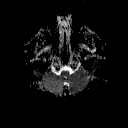
[im 9/9]
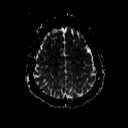

[Series 10: FLAIR · axial · 5.0mm · 0.45mm/px · z∈[-45,+83]mm · 5 of 22 slices shown]
[im 1/22]
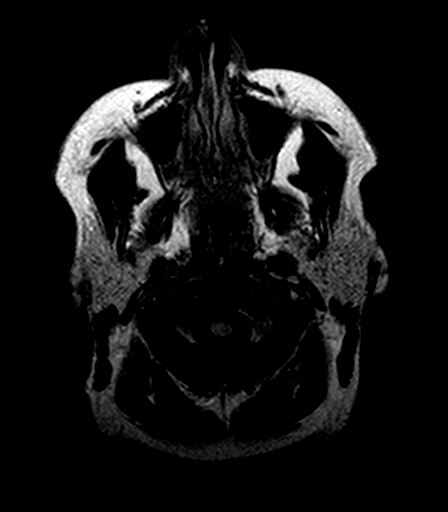
[im 6/22]
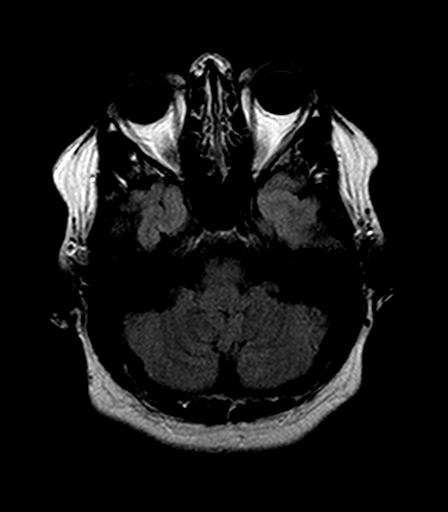
[im 11/22]
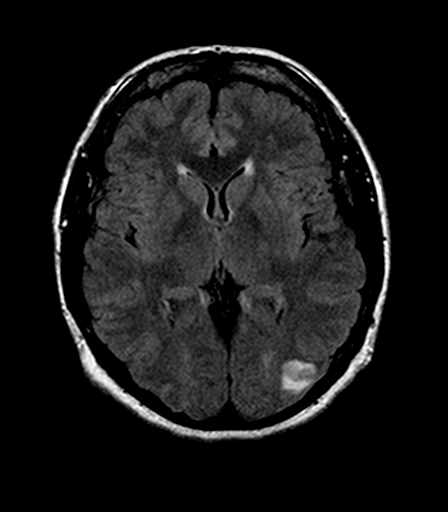
[im 16/22]
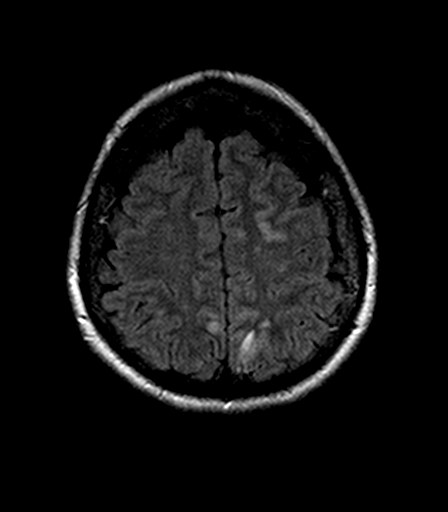
[im 22/22]
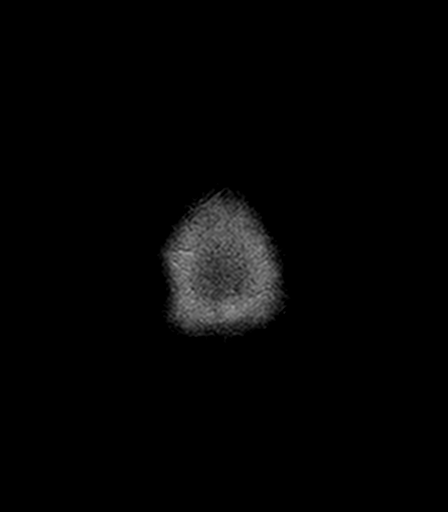

[Series 11: T2 · axial · 5.0mm · 0.72mm/px · z∈[-45,+83]mm · 6 of 24 slices shown (1 of 2)]
[im 1/24]
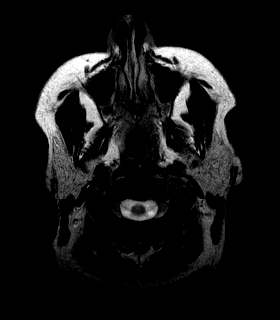
[im 5/24]
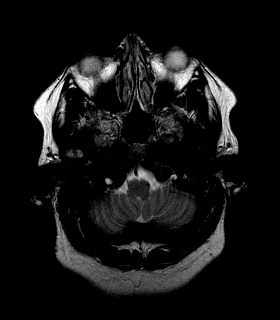
[im 10/24]
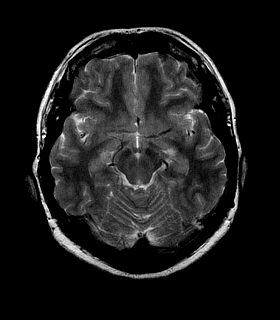
[im 14/24]
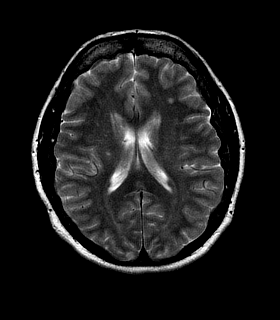
[im 19/24]
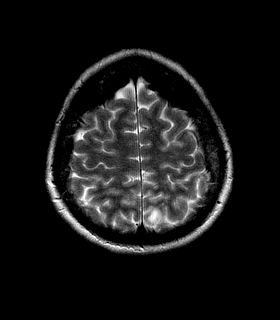
[im 24/24]
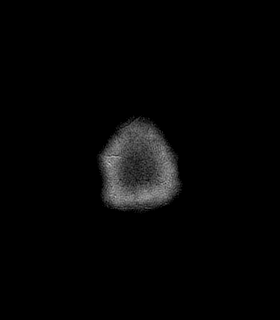

[Series 12: T1 · axial · 5.0mm · 0.45mm/px · z∈[-45,+83]mm · 6 of 24 slices shown (2 of 2)]
[im 1/24]
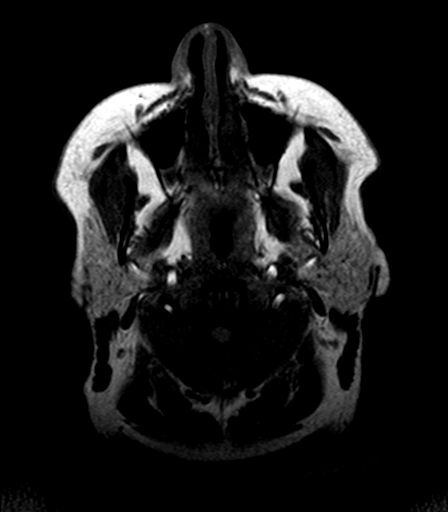
[im 5/24]
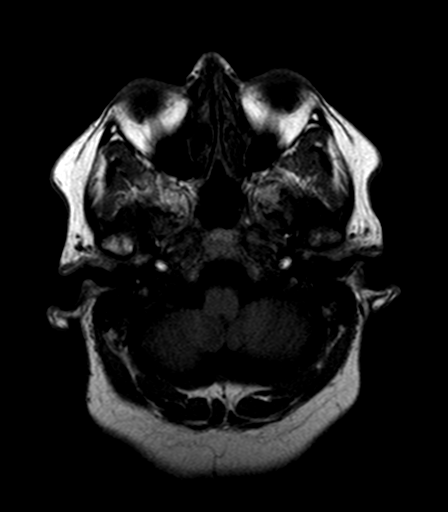
[im 10/24]
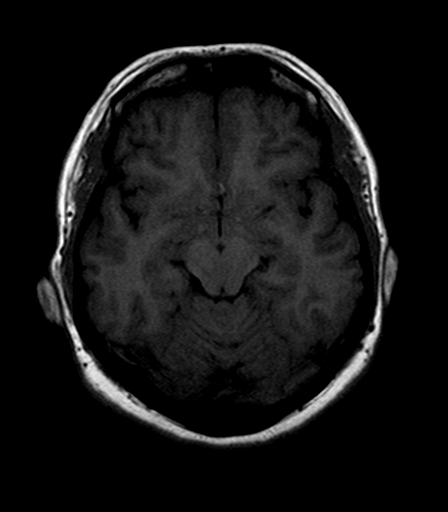
[im 14/24]
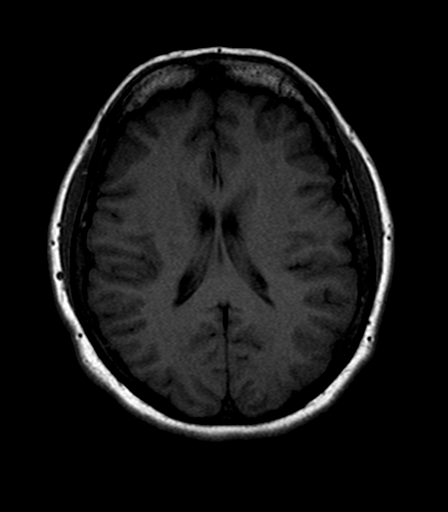
[im 19/24]
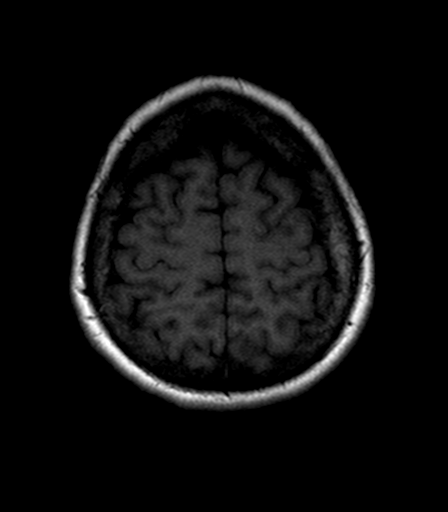
[im 24/24]
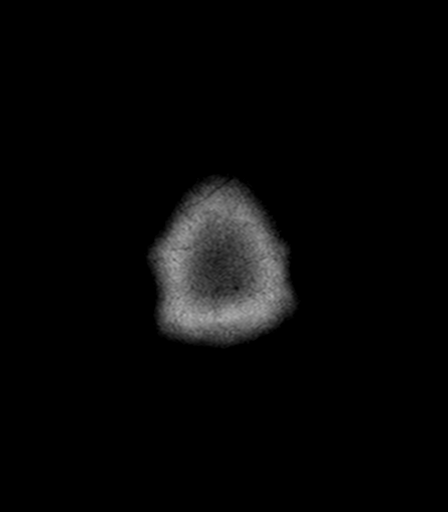

[Series 14: T2 · coronal · 5.0mm · 0.69mm/px · 6 of 26 slices shown (2 of 2)]
[im 1/26]
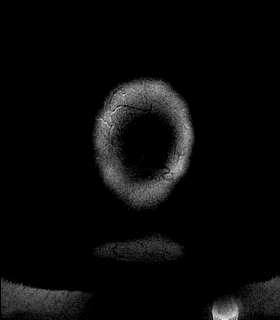
[im 6/26]
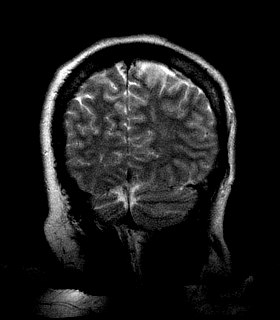
[im 11/26]
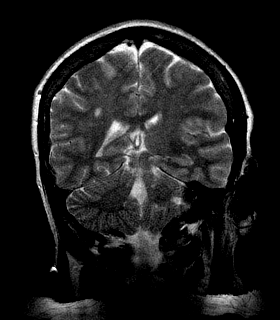
[im 16/26]
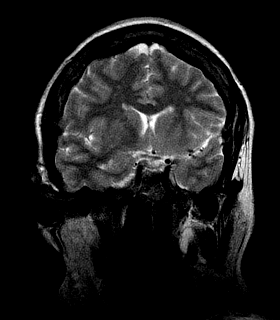
[im 21/26]
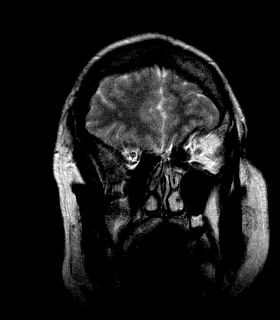
[im 26/26]
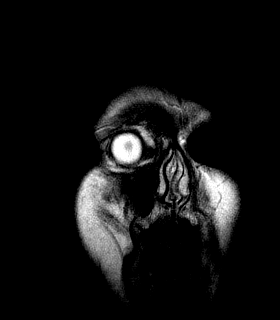

[Series 17: T1 fat-sat post-contrast · axial · 5.0mm · 0.90mm/px · z∈[-53,+74]mm · 6 of 24 slices shown (1 of 2)]
[im 1/24]
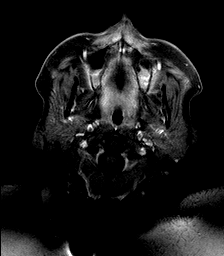
[im 5/24]
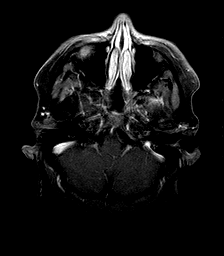
[im 10/24]
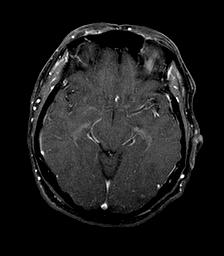
[im 14/24]
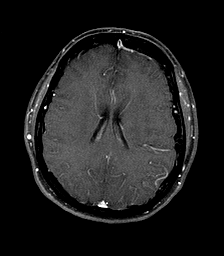
[im 19/24]
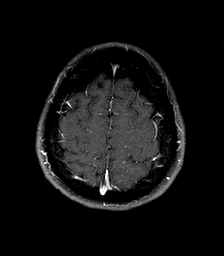
[im 24/24]
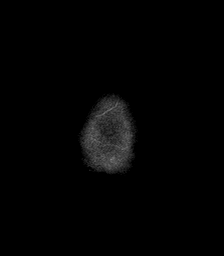

[Series 19: T1 fat-sat post-contrast · coronal · 5.0mm · 0.90mm/px · 5 of 26 slices shown (2 of 2)]
[im 1/26]
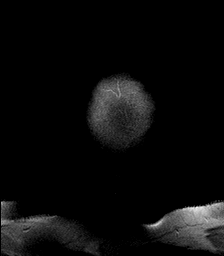
[im 6/26]
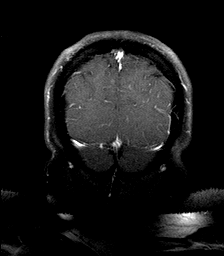
[im 11/26]
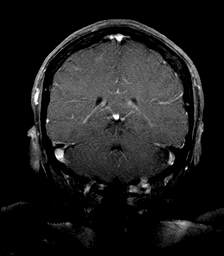
[im 16/26]
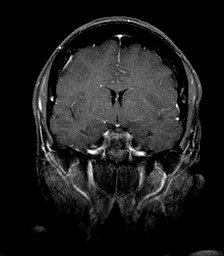
[im 21/26]
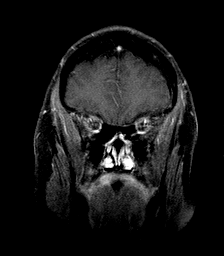

[41 of 48 positions shown; findings below may reference images not displayed]

DIAGNOSTIC STUDIES

EXAM

MRI of the brain and MR angiography with without contrast

INDICATION

AMS
PT PRESENTED TO ER LAST NIGHT WITH AMS, CONFUSION, HIGH BP. PT HAS NO MEMORY OF IT.  HUSBAND STATES
PT HAD SEVERE HEADACHE, THEN FELL TO GROUND, URINATED, BLUE LIPS.  PT STATES SHE STILL HAS PRESSURE
TO THE HEAD.  15 ML
GADAVIST RG

TECHNIQUE

Sagittal axial coronal images were obtained with variable T1 and T2 weighting before after
administration contrast. 3D  time-of-flight MR angiography was obtained through the brain.

COMPARISONS

None available

FINDINGS

Multiple FLAIR signal abnormalities are noted throughout the brain. Left cortical occipital lesion
measures 1.5 cm image 12 series tendon demonstrates minimal enhancement and equivocal diffusion
restriction. Small cortical focus measuring 1.3 cm adjacent to the falx in the left parietal
occipital region is seen image 19 series 10. There is no enhancement in this location or diffusion
restriction.

There is subtle FLAIR signal involving cortical gyri bilaterally at the cerebral vertex image 20
series 10. There are also multiple small areas of FLAIR signal involving the supratentorial white
matter for instance a 6 millimeter focus within the right frontal parietal white matter image 16
series 10. These also do not demonstrate abnormal enhancement. Questionable subtle FLAIR signal can
be identified within the posterior cerebellar hemispheres bilaterally.

There is no intracranial hemorrhage or mass effect. Ventricular system is normal. There is normal
opacification of the major intracranial vascular structures.

3D time-of-flight MR angiography demonstrates normal flow throughout the vertebral basilar system
and cavernous carotid arteries is well as the circle Willis without evidence for stenosis or vessel
cutoff.

Differential considerations would include atypical manifestation of PRES given posterior
predominant distribution. Infectious process such as Lyme disease or other atypical organisms should
also be considered. Demyelinating process such as MS is felt unlikely given cortical involvement but
not completely excluded. Vasculitis could possibly account for some of the findings. Low-grade
neoplasm could also have this appearance.

IMPRESSION

Multiple areas of T2 FLAIR signal as described and discussed above with peripheral cortical
enhancing focus in the left occipital lobe. Please see above discussion. Lumbar puncture may be
beneficial and appropriate neurology consultation is recommended. Report called to Jhoshua Samano at

Tech Notes:

PT PRESENTED TO ER LAST NIGHT WITH AMS, CONFUSION, HIGH BP. PT HAS NO MEMORY OF IT.  HUSBAND STATES
PT HAD SEVERE HEADACHE, THEN FELL TO GROUND, URINATED, BLUE LIPS.  PT STATES SHE STILL HAS PRESSURE
TO THE HEAD.  15 ML GADAVIST RG

## 2021-10-12 NOTE — Progress Notes
46 yr old female  PMH: HTN, DM, HLD, depression  Severe sudden HA- neck/posterior head, N/V  Per husband- 2 episodes tonic clonic activity lasting 2-3 minutes each   Incont of urine  EMS gave ketamine- sedated on arrival to ER  Hypertensive on arrival SBP 180-190's  Admitted to tele  Last emesis this am- Reglan  Sleeping most of the day- arousable and appropriate  WBC 11.6  Hgb 16.9  Lactate 10.5 on admit - normal after 2L fluids   CRP was 0.62  Gluc 195  K 3.5 - replacing  Chemistry otherwise normal  TSH normal  114/55, 80, 95% RA, afebrile  MRI/MRA today- multiple flairing P2 signals, concern for PRES  Reason for request:Radiology recommending LP- unable to do at sending facility, Needs Neurology service as well, and higher level of care
